# Patient Record
Sex: Female | Born: 1960 | Race: White | Hispanic: No | Smoking: Never smoker
Health system: Southern US, Community
[De-identification: ages and names within clinical notes are randomized; demographics above are authoritative.]

## PROBLEM LIST (undated history)

## (undated) DIAGNOSIS — C4490 Unspecified malignant neoplasm of skin, unspecified: Secondary | ICD-10-CM

## (undated) DIAGNOSIS — F32A Depression, unspecified: Secondary | ICD-10-CM

## (undated) DIAGNOSIS — M549 Dorsalgia, unspecified: Secondary | ICD-10-CM

## (undated) DIAGNOSIS — M810 Age-related osteoporosis without current pathological fracture: Secondary | ICD-10-CM

## (undated) DIAGNOSIS — K219 Gastro-esophageal reflux disease without esophagitis: Secondary | ICD-10-CM

## (undated) DIAGNOSIS — M1990 Unspecified osteoarthritis, unspecified site: Secondary | ICD-10-CM

## (undated) DIAGNOSIS — C801 Malignant (primary) neoplasm, unspecified: Secondary | ICD-10-CM

## (undated) DIAGNOSIS — D0510 Intraductal carcinoma in situ of unspecified breast: Secondary | ICD-10-CM

## (undated) DIAGNOSIS — K589 Irritable bowel syndrome without diarrhea: Secondary | ICD-10-CM

## (undated) DIAGNOSIS — B1920 Unspecified viral hepatitis C without hepatic coma: Secondary | ICD-10-CM

## (undated) DIAGNOSIS — F329 Major depressive disorder, single episode, unspecified: Secondary | ICD-10-CM

## (undated) DIAGNOSIS — Z9221 Personal history of antineoplastic chemotherapy: Secondary | ICD-10-CM

## (undated) DIAGNOSIS — F419 Anxiety disorder, unspecified: Secondary | ICD-10-CM

## (undated) DIAGNOSIS — B009 Herpesviral infection, unspecified: Secondary | ICD-10-CM

## (undated) DIAGNOSIS — C50919 Malignant neoplasm of unspecified site of unspecified female breast: Secondary | ICD-10-CM

## (undated) HISTORY — PX: TONSILLECTOMY: SUR1361

## (undated) HISTORY — DX: Malignant neoplasm of unspecified site of unspecified female breast: C50.919

## (undated) HISTORY — PX: FOOT SURGERY: SHX648

## (undated) HISTORY — PX: BREAST SURGERY: SHX581

## (undated) HISTORY — PX: OTHER SURGICAL HISTORY: SHX169

## (undated) HISTORY — PX: MASTECTOMY: SHX3

## (undated) HISTORY — DX: Irritable bowel syndrome, unspecified: K58.9

## (undated) HISTORY — PX: ABDOMINAL HYSTERECTOMY: SHX81

## (undated) HISTORY — PX: MASTECTOMY, MODIFIED RADICAL W/RECONSTRUCTION: SHX708

## (undated) HISTORY — DX: Herpesviral infection, unspecified: B00.9

## (undated) HISTORY — DX: Unspecified viral hepatitis C without hepatic coma: B19.20

---
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
## 1997-01-12 HISTORY — PX: TOTAL ABDOMINAL HYSTERECTOMY W/ BILATERAL SALPINGOOPHORECTOMY: SHX83

## 1997-09-03 ENCOUNTER — Other Ambulatory Visit: Admission: RE | Admit: 1997-09-03 | Discharge: 1997-09-03 | Payer: Self-pay | Admitting: Gynecology

## 1997-12-27 ENCOUNTER — Ambulatory Visit (HOSPITAL_BASED_OUTPATIENT_CLINIC_OR_DEPARTMENT_OTHER): Admission: RE | Admit: 1997-12-27 | Discharge: 1997-12-27 | Payer: Self-pay | Admitting: Orthopedic Surgery

## 1998-06-13 ENCOUNTER — Ambulatory Visit (HOSPITAL_BASED_OUTPATIENT_CLINIC_OR_DEPARTMENT_OTHER): Admission: RE | Admit: 1998-06-13 | Discharge: 1998-06-13 | Payer: Self-pay | Admitting: Orthopedic Surgery

## 1998-10-10 ENCOUNTER — Other Ambulatory Visit: Admission: RE | Admit: 1998-10-10 | Discharge: 1998-10-10 | Payer: Self-pay | Admitting: Gynecology

## 1998-10-13 HISTORY — PX: CHOLECYSTECTOMY: SHX55

## 1999-09-30 ENCOUNTER — Emergency Department (HOSPITAL_COMMUNITY): Admission: EM | Admit: 1999-09-30 | Discharge: 1999-09-30 | Payer: Self-pay | Admitting: Emergency Medicine

## 1999-10-23 ENCOUNTER — Other Ambulatory Visit: Admission: RE | Admit: 1999-10-23 | Discharge: 1999-10-23 | Payer: Self-pay | Admitting: Gynecology

## 1999-12-18 ENCOUNTER — Encounter: Admission: RE | Admit: 1999-12-18 | Discharge: 2000-03-17 | Payer: Self-pay | Admitting: Gynecology

## 2000-10-26 ENCOUNTER — Encounter: Admission: RE | Admit: 2000-10-26 | Discharge: 2001-01-24 | Payer: Self-pay | Admitting: Gynecology

## 2000-11-04 ENCOUNTER — Other Ambulatory Visit: Admission: RE | Admit: 2000-11-04 | Discharge: 2000-11-04 | Payer: Self-pay | Admitting: Gynecology

## 2001-04-19 ENCOUNTER — Ambulatory Visit (HOSPITAL_COMMUNITY): Admission: RE | Admit: 2001-04-19 | Discharge: 2001-04-19 | Payer: Self-pay | Admitting: Gastroenterology

## 2001-06-24 ENCOUNTER — Ambulatory Visit (HOSPITAL_BASED_OUTPATIENT_CLINIC_OR_DEPARTMENT_OTHER): Admission: RE | Admit: 2001-06-24 | Discharge: 2001-06-24 | Payer: Self-pay | Admitting: Orthopedic Surgery

## 2001-11-08 ENCOUNTER — Other Ambulatory Visit: Admission: RE | Admit: 2001-11-08 | Discharge: 2001-11-08 | Payer: Self-pay | Admitting: Gynecology

## 2002-06-06 ENCOUNTER — Ambulatory Visit (HOSPITAL_COMMUNITY): Admission: RE | Admit: 2002-06-06 | Discharge: 2002-06-06 | Payer: Self-pay | Admitting: Orthopedic Surgery

## 2002-06-06 ENCOUNTER — Encounter: Payer: Self-pay | Admitting: Orthopedic Surgery

## 2002-08-30 ENCOUNTER — Ambulatory Visit (HOSPITAL_BASED_OUTPATIENT_CLINIC_OR_DEPARTMENT_OTHER): Admission: RE | Admit: 2002-08-30 | Discharge: 2002-08-30 | Payer: Self-pay | Admitting: Orthopedic Surgery

## 2004-03-05 ENCOUNTER — Ambulatory Visit: Payer: Self-pay | Admitting: Hematology & Oncology

## 2004-11-04 ENCOUNTER — Ambulatory Visit: Payer: Self-pay | Admitting: Internal Medicine

## 2005-01-23 ENCOUNTER — Ambulatory Visit (HOSPITAL_BASED_OUTPATIENT_CLINIC_OR_DEPARTMENT_OTHER): Admission: RE | Admit: 2005-01-23 | Discharge: 2005-01-23 | Payer: Self-pay | Admitting: Orthopedic Surgery

## 2005-11-12 ENCOUNTER — Ambulatory Visit: Payer: Self-pay | Admitting: Internal Medicine

## 2006-08-19 ENCOUNTER — Ambulatory Visit: Payer: Self-pay | Admitting: Internal Medicine

## 2006-12-07 ENCOUNTER — Ambulatory Visit: Payer: Self-pay | Admitting: Internal Medicine

## 2007-02-21 ENCOUNTER — Ambulatory Visit: Payer: Self-pay | Admitting: Internal Medicine

## 2007-09-16 ENCOUNTER — Encounter (INDEPENDENT_AMBULATORY_CARE_PROVIDER_SITE_OTHER): Payer: Self-pay | Admitting: Interventional Radiology

## 2007-09-16 ENCOUNTER — Ambulatory Visit (HOSPITAL_COMMUNITY): Admission: RE | Admit: 2007-09-16 | Discharge: 2007-09-16 | Payer: Self-pay | Admitting: Gastroenterology

## 2007-11-06 ENCOUNTER — Ambulatory Visit: Payer: Self-pay | Admitting: Family Medicine

## 2008-01-25 ENCOUNTER — Ambulatory Visit: Payer: Self-pay | Admitting: Internal Medicine

## 2008-05-14 ENCOUNTER — Ambulatory Visit: Payer: Self-pay | Admitting: Internal Medicine

## 2008-08-29 ENCOUNTER — Emergency Department: Payer: Self-pay | Admitting: Emergency Medicine

## 2008-12-05 ENCOUNTER — Encounter: Admission: RE | Admit: 2008-12-05 | Discharge: 2008-12-05 | Payer: Self-pay | Admitting: Gastroenterology

## 2009-01-29 ENCOUNTER — Ambulatory Visit: Payer: Self-pay | Admitting: Internal Medicine

## 2010-01-15 ENCOUNTER — Encounter
Admission: RE | Admit: 2010-01-15 | Discharge: 2010-01-15 | Payer: Self-pay | Source: Home / Self Care | Attending: Gastroenterology | Admitting: Gastroenterology

## 2010-05-30 NOTE — Op Note (Signed)
Belinda Norman, Belinda Norman                           ACCOUNT NO.:  [id number]   MEDICAL RECORD NO.:  [id number]                   PATIENT TYPE:  AMB   LOCATION:  DSC                                  FACILITY:  [facility]   PHYSICIAN:  Harvie Junior, M.D.                DATE OF BIRTH:  06-22-1960   DATE OF PROCEDURE:  08/30/2002  DATE OF DISCHARGE:                                 OPERATIVE REPORT   PREOPERATIVE DIAGNOSIS:  1. Tarsal tunnel syndrome, left foot.  2. Plantar fasciitis with painful bone spur, left heel.   POSTOPERATIVE DIAGNOSIS:  1. Tarsal tunnel syndrome, left foot.  2. Plantar fasciitis with painful bone spur, left heel.   OPERATION PERFORMED:  1. Release of tibial nerve and release of tarsal tunnel syndrome.  2. Plantar fascia release.  3. Excision of plantar bone spur.   SURGEON:  Harvie Junior, M.D.   ASSISTANT:  Marshia Ly, P.A.   ANESTHESIA:  General.   INDICATIONS FOR PROCEDURE:  The patient is a 50 year old female with a long  history of having heel pain.  She ultimately had been injected in the area  of the tibial nerve with some improvement.  She had been injected in the  area of the plantar fascia with improvement.  She had continued pain, failed  a  year of conservative care for heel pain.  Because of continuing  complaints of pain, we ultimately felt that surgical intervention was  appropriate.  We discussed treatment options and ultimately felt that  release of the tibial nerve as well as plantar fascia release would be  appropriate and that excision of the bone spur would be important.  She was  brought to the operating room for this procedure.   DESCRIPTION OF PROCEDURE:  The patient was brought to the operating room and  after adequate anesthesia was obtained with general anesthetic, the patient  was supine on the table.  The left leg was prepped and draped in the usual  sterile fashion.  Following Esmarch exsanguination of the extremity a  blood  pressure tourniquet was inflated to .  Following this, a linear  incision was made on the medial aspect of  the heel.  Subcutaneous tissue  dissected down to the level of the tarsal tunnel.  The tibial nerve was  identified and followed down to its branches of the medial and lateral  plantar nerves and this was followed down to the calcaneal branch.  These  dived into the abductor hallucis muscle and the fascia of the abductor  hallucis was divided posteriorly, what essentially would be laterally and  medially.  This freed up the nerve onto the plantar aspect of the heel.  At  this point attention was turned towards the nerve which was then freed up  proximally and all bands across the nerve were released.  At this point  there was significant engorgement  of vessels in this area as well as one  interesting branch which came posteriorly and felt that this probably was as  much a compressing agent as anything.  At this point attention was turned to  the plantar aspect.  The incision was extended distally.  The abductor  hallucis was retracted proximally and the plantar fascia was identified and  then divided across the bottom of the foot.  Care was taken to identify the  proximal and distal portions of this.  Once this has been done, the flexor  hallucis brevis muscle was identified and tracked proximally to its origin  on the heel.  The flexor hallucis was then taken down with care to protect  the superior lying nerve and vessel and then the heel spur was resected with  an osteotome and debrided with a rongeur.  At this point fluoroscopy was  used to ensure adequate localization of the heel spur.  The wound was  copiously irrigated and suctioned dry and the skin was closed with  interrupted suture.  A sterile compressive dressing was applied and the  patient was placed into plaster.  She was then transferred to the recovery  room where she was noted to be in satisfactory  condition.  The estimated  blood loss for this procedure was none.                                                Harvie Junior, M.D.    Ranae Plumber  D:  08/30/2002  T:  08/30/2002  Job:  [id number]

## 2010-05-30 NOTE — Op Note (Signed)
NAME:  Belinda Norman, Belinda Norman               ACCOUNT NO.:  [id number]   MEDICAL RECORD NO.:  [id number]          PATIENT TYPE:  AMB   LOCATION:  DSC                          FACILITY:  [facility]   PHYSICIAN:  Harvie Junior, M.D.   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   POSTOPERATIVE DIAGNOSIS:  1.  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  2.  Tight lateral retinaculum.  3.  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   PROCEDURE:  1.  Partial posterior horn medial meniscectomy with debridement of the      medial femoral condyle.  2.  Chondroplasty of the patellofemoral joint.  3.  Lateral retinacular release.   SURGEON:  Harvie Junior, M.D.   ASSISTANT:  Marshia Ly, P.A.-C.   ANESTHESIA:  General.   BRIEF HISTORY:  50 year old female with a long history of having had  previous knee arthroscopy.  She has had continued and persistent anterior  knee pain status post her last arthroscopy increasing over the past year.  Since that period of time, we have treated her with steroid injection, anti-  inflammatory medication, activity modification, and quad strengthening.  She  has failed all this and because of continued complaints of pain, she was  ultimately taken to the operating room for operative knee arthroscopy.   DESCRIPTION OF PROCEDURE:  The patient was taken to the operating room and  after adequate anesthesia was obtained with general anesthesia, the patient  was placed on the operating table, the right leg was prepped and draped in  the usual sterile fashion.  Following this, routine arthroscopic examination  of the knee revealed there was an obvious posterior horn medial meniscal  tear.  This was debrided with the straight biting forceps back to a  smooth  and stable rim.  Attention was turned to the medial femoral condyle which  had some grade 2 chondromalacia which was debrided to smooth and stable  situation.  Attention was turned to the lateral side which was pristine, ACL  normal.  The patellofemoral joint was evaluated and had a little bit of  lateral chondromalacia and a little bit of tightness on the lateral side and  a little bit of lateral patellar tracking.  At this point, the area of  lateral release was re-released.  Certainly, the area behind the patella  became much less tight, and the wound was copiously irrigated and  suctioned dry.  The arthroscopic portals were closed with a bandage.  A  sterile compressive dressing was applied.  The patient was taken to the  recovery room and was noted to be in satisfactory condition.  Estimated  blood loss was none.      Harvie Junior, M.D.  Electronically Signed     JLG/MEDQ  D:  01/23/2005  T:  01/23/2005  Job:  [id number]

## 2010-10-15 LAB — CBC
HCT: 38.1
Hemoglobin: 13.2
Hemoglobin: 16.2 — ABNORMAL HIGH
MCHC: 35.2
MCV: 95.6
RDW: 12.4
WBC: 5.1

## 2010-10-15 LAB — APTT: aPTT: 32

## 2011-09-15 DIAGNOSIS — M533 Sacrococcygeal disorders, not elsewhere classified: Secondary | ICD-10-CM | POA: Insufficient documentation

## 2011-11-11 ENCOUNTER — Telehealth: Payer: Self-pay | Admitting: Internal Medicine

## 2011-11-11 NOTE — Telephone Encounter (Signed)
Pt is wanting to follow you over here to [place] from [place]. 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 15th. She was wondering if she could come in to have this done. Or if she could come in to have the labs completed for the form and come back later for the CPE. She does apologize for such short notice. Best number  Work :[phone] [phone]

## 2011-11-11 NOTE — Telephone Encounter (Signed)
See if she can come on 11/18/11 at 12:00.  Work in for this.  Thanks.

## 2011-11-12 NOTE — Telephone Encounter (Signed)
Pt aware of appointment 

## 2011-11-18 ENCOUNTER — Ambulatory Visit (INDEPENDENT_AMBULATORY_CARE_PROVIDER_SITE_OTHER): Payer: BC Managed Care – PPO | Admitting: Internal Medicine

## 2011-11-18 ENCOUNTER — Encounter: Payer: Self-pay | Admitting: Internal Medicine

## 2011-11-18 VITALS — BP 110/77 | HR 67 | Temp 97.8°F | Resp 18 | Ht 66.0 in | Wt 150.0 lb

## 2011-11-18 DIAGNOSIS — R5383 Other fatigue: Secondary | ICD-10-CM

## 2011-11-18 DIAGNOSIS — F329 Major depressive disorder, single episode, unspecified: Secondary | ICD-10-CM

## 2011-11-18 DIAGNOSIS — Z139 Encounter for screening, unspecified: Secondary | ICD-10-CM

## 2011-11-18 DIAGNOSIS — K219 Gastro-esophageal reflux disease without esophagitis: Secondary | ICD-10-CM

## 2011-11-18 DIAGNOSIS — B1920 Unspecified viral hepatitis C without hepatic coma: Secondary | ICD-10-CM

## 2011-11-18 LAB — CBC WITH DIFFERENTIAL/PLATELET
Basophils Relative: 0.5 % (ref 0.0–3.0)
Eosinophils Absolute: 0.1 10*3/uL (ref 0.0–0.7)
HCT: 44.6 % (ref 36.0–46.0)
Hemoglobin: 15.2 g/dL — ABNORMAL HIGH (ref 12.0–15.0)
Lymphocytes Relative: 27.6 % (ref 12.0–46.0)
Lymphs Abs: 1.2 10*3/uL (ref 0.7–4.0)
MCHC: 34.1 g/dL (ref 30.0–36.0)
MCV: 96.6 fl (ref 78.0–100.0)
Monocytes Absolute: 0.3 10*3/uL (ref 0.1–1.0)
Neutro Abs: 2.7 10*3/uL (ref 1.4–7.7)
RBC: 4.62 Mil/uL (ref 3.87–5.11)
RDW: 12.4 % (ref 11.5–14.6)

## 2011-11-18 LAB — LIPID PANEL
LDL Cholesterol: 72 mg/dL (ref 0–99)
Total CHOL/HDL Ratio: 3
Triglycerides: 88 mg/dL (ref 0.0–149.0)

## 2011-11-18 MED ORDER — PANTOPRAZOLE SODIUM 40 MG PO TBEC: 40.0000 mg | DELAYED_RELEASE_TABLET | Freq: Every day | ORAL | Status: DC

## 2011-11-18 NOTE — Patient Instructions (Addendum)
It was nice seeing you today.  I am sorry about your father.  We will notify you of your lab results - once they are available.  I am going to start you on Protonix (take one 30 minutes before breakfast).  Keep your follow up appt with Dr Kinnie Scales.

## 2011-11-20 ENCOUNTER — Telehealth: Payer: Self-pay | Admitting: Internal Medicine

## 2011-11-20 DIAGNOSIS — D696 Thrombocytopenia, unspecified: Secondary | ICD-10-CM

## 2011-11-20 NOTE — Telephone Encounter (Signed)
Notify pt cholesterol looks good.  Sugar is wnl.  Hemoglobin is ok but platelet count is decreased.  Will need to follow to confirm stable/improved.  i want her to return for a cbc in 10 days.  Please schedule a time for pt to come back in.

## 2011-11-20 NOTE — Telephone Encounter (Signed)
Scheduled patient for Labs on 11/14 at 9:00am. Did you want to see her for a patient visit.

## 2011-11-20 NOTE — Telephone Encounter (Signed)
Left message for patient to return call.

## 2011-11-20 NOTE — Telephone Encounter (Signed)
No just labs

## 2011-11-24 ENCOUNTER — Telehealth: Payer: Self-pay | Admitting: Internal Medicine

## 2011-11-24 NOTE — Telephone Encounter (Signed)
Pt is coming in Thursday for blood work. Pt was seen by Dr. Kinnie Scales and would like you to call her back. Please call pt at work number after 10 am.

## 2011-11-24 NOTE — Telephone Encounter (Signed)
Called patient to let her know.Patient said that she signed a paper while was at Dr. Jennye Boroughs to have tham send you a copy a labs.

## 2011-11-24 NOTE — Telephone Encounter (Signed)
She does not need to come back here for follow up platelet count if she just had rechecked, but have her call Dr Rossie Muskrat office and have them send me a copy of her labs to compare.

## 2011-11-24 NOTE — Telephone Encounter (Signed)
Please call and find out what she needs.   Thanks.

## 2011-11-24 NOTE — Telephone Encounter (Signed)
Called patient at the work. Patient stated that she went to see Dr. Kinnie Scales and her platelet count is low. They are sending her to [facility] for further testing. She wanted to know if she still needed to come by for labs on Thursday since she has to go to [facility]?

## 2011-11-26 ENCOUNTER — Other Ambulatory Visit: Payer: BC Managed Care – PPO

## 2011-12-05 ENCOUNTER — Encounter: Payer: Self-pay | Admitting: Internal Medicine

## 2011-12-05 DIAGNOSIS — F329 Major depressive disorder, single episode, unspecified: Secondary | ICD-10-CM | POA: Insufficient documentation

## 2011-12-05 DIAGNOSIS — K219 Gastro-esophageal reflux disease without esophagitis: Secondary | ICD-10-CM | POA: Insufficient documentation

## 2011-12-05 NOTE — Progress Notes (Signed)
  Subjective:    Patient ID: Belinda Norman, female    DOB: 1960/12/18, 51 y.o.   MRN: [id number]  HPI 51 year old female with past history of hepatitis C, IBS and depression/anxiety who comes in today as a work in to have a work form completed.  She has been under a lot of stress lately.  Her father died in 2022/07/22.  She broke up with her long term boyfriend.  They are back together now.  Feels she is doing better.  Handling stress relatively well.  We did discuss my desire for her to see a counselor/psychiatrist.  No suicidal ideations.  She feels she is doing better.  She has been having trouble with increased acid reflux.  Has worsened over the last two months.  Has been taking aloe vera.  Due to follow up with Dr Kinnie Scales next week.  Has labs and an ultrasound.  No abdominal pain and no significant bowel change.    Past Medical History  Diagnosis Date  . Hepatitis C   . HSV (herpes simplex virus) infection   . IBS (irritable bowel syndrome)     Review of Systems Patient denies any headache, lightheadedness or dizziness.  No sinus or allergy symptoms. No chest pain, tightness or palpitations.  No increased shortness of breath, cough or congestion.  No nausea or vomiting.  Does report the increased acid reflux as outlined.  No abdominal pain or cramping.  No significant  bowel change, such as diarrhea, constipation, BRBPR or melana.  No urine change.        Objective:   Physical Exam Filed Vitals:   11/18/11 1209  BP: 110/77  Pulse: 67  Temp: 97.8 F (36.6 C)  Resp: 28   51 year old female in no acute distress.   HEENT:  Nares - clear.  OP- without lesions or erythema.  NECK:  Supple, nontender.  No audible bruit.   HEART:  Appears to be regular. LUNGS:  Without crackles or wheezing audible.  Respirations even and unlabored.   RADIAL PULSE:  Equal bilaterally.  ABDOMEN:  Soft, nontender.  No audible abdominal bruit.   EXTREMITIES:  No increased edema to be present.                    Assessment & Plan:  FORM COMPLETION.  Will obtain the recommended labs and then complete form.    HEALTH MAINTENANCE.  Obtain outside records for review.  Keep her on track with her physicals and mammograms.

## 2011-12-05 NOTE — Assessment & Plan Note (Signed)
Had labs and an ultrasound ordered by Dr Kinnie Scales.  Due to see him next week.

## 2011-12-05 NOTE — Assessment & Plan Note (Signed)
Worsening acid reflux.  On no prescription medication.  Start Protonix 40mg  q day.  Follow.  She will discuss more with Dr Kinnie Scales with question of need for EGD.

## 2011-12-05 NOTE — Assessment & Plan Note (Signed)
Has a history of depression/anxiety.  On Citalopram.  Increased stress recently with her father's death and her relationship with her boyfriend.  Doing better now and feels better.  Discussed counseling and referral to psychiatry.  Will notify me when agreeable.

## 2012-01-18 ENCOUNTER — Other Ambulatory Visit (HOSPITAL_COMMUNITY): Payer: Self-pay | Admitting: Gastroenterology

## 2012-01-18 DIAGNOSIS — B182 Chronic viral hepatitis C: Secondary | ICD-10-CM

## 2012-01-19 ENCOUNTER — Other Ambulatory Visit: Payer: Self-pay | Admitting: Radiology

## 2012-01-19 ENCOUNTER — Encounter (HOSPITAL_COMMUNITY): Payer: Self-pay

## 2012-01-21 ENCOUNTER — Ambulatory Visit (HOSPITAL_COMMUNITY)
Admission: RE | Admit: 2012-01-21 | Discharge: 2012-01-21 | Disposition: A | Discharge status: Home / Self Care | Payer: BC Managed Care – PPO | Source: Ambulatory Visit | Attending: Gastroenterology | Admitting: Gastroenterology

## 2012-01-21 ENCOUNTER — Encounter (HOSPITAL_COMMUNITY): Payer: Self-pay

## 2012-01-21 VITALS — BP 110/61 | HR 64 | Temp 97.5°F | Resp 12 | Ht 66.0 in | Wt 155.0 lb

## 2012-01-21 DIAGNOSIS — B182 Chronic viral hepatitis C: Secondary | ICD-10-CM | POA: Insufficient documentation

## 2012-01-21 LAB — CBC
MCH: 32.7 pg (ref 26.0–34.0)
Platelets: 80 10*3/uL — ABNORMAL LOW (ref 150–400)
RBC: 4.46 MIL/uL (ref 3.87–5.11)

## 2012-01-21 LAB — APTT: aPTT: 33 seconds (ref 24–37)

## 2012-01-21 LAB — PROTIME-INR
INR: 1.13 (ref 0.00–1.49)
Prothrombin Time: 14.3 seconds (ref 11.6–15.2)

## 2012-01-21 MED ORDER — HYDROMORPHONE HCL PF 1 MG/ML IJ SOLN
2.0000 mg | INTRAMUSCULAR | Status: AC
  Administered 2012-01-21: 2 mg via INTRAVENOUS

## 2012-01-21 MED ORDER — MIDAZOLAM HCL 2 MG/2ML IJ SOLN
INTRAMUSCULAR | Status: AC | PRN
  Administered 2012-01-21 (×2): 1 mg via INTRAVENOUS

## 2012-01-21 MED ORDER — FENTANYL CITRATE 0.05 MG/ML IJ SOLN
INTRAMUSCULAR | Status: AC | PRN
  Administered 2012-01-21 (×2): 50 ug via INTRAVENOUS

## 2012-01-21 MED ORDER — SODIUM CHLORIDE 0.9 % IV SOLN
Freq: Once | INTRAVENOUS | Status: AC
  Administered 2012-01-21: 09:00:00 via INTRAVENOUS

## 2012-01-21 MED ORDER — HYDROMORPHONE HCL PF 2 MG/ML IJ SOLN
2.0000 mg | INTRAMUSCULAR | Status: DC
  Filled 2012-01-21: qty 1

## 2012-01-21 MED ORDER — MIDAZOLAM HCL 2 MG/2ML IJ SOLN
INTRAMUSCULAR | Status: AC
  Filled 2012-01-21: qty 4

## 2012-01-21 MED ORDER — FENTANYL CITRATE 0.05 MG/ML IJ SOLN
INTRAMUSCULAR | Status: AC
  Filled 2012-01-21: qty 4

## 2012-01-21 MED ORDER — HYDROMORPHONE HCL PF 2 MG/ML IJ SOLN
2.0000 mg | INTRAMUSCULAR | Status: DC
  Filled 2012-01-21: qty 1
  Filled 2012-01-21: qty 2
  Filled 2012-01-21: qty 1

## 2012-01-21 NOTE — H&P (Signed)
Belinda Norman is an 52 y.o. female.   Chief Complaint: Hep C x 20 yrs Evaluation for new treatment Scheduled for liver core biopsy HPI: Hep C; IBS  Past Medical History  Diagnosis Date  . Hepatitis C   . HSV (herpes simplex virus) infection   . IBS (irritable bowel syndrome)     Past Surgical History  Procedure Date  . Cholecystectomy 10/1998  . Total abdominal hysterectomy w/ bilateral salpingoophorectomy 1999  . Cesarean section   . Tonsillectomy   . Arthroscopic knee surgery     x2  . Knee surgery with nerve repair   . Foot surgery     Family History  Problem Relation Age of Onset  . Hypertension Father   . Liver cancer Mother     bile duct cancer  . Alcohol abuse Mother   . Diabetes Brother   . Breast cancer Paternal Aunt   . Throat cancer Paternal Grandmother   . Prostate cancer Paternal Grandfather   . Colon cancer Neg Hx    Social History:  reports that she has never smoked. She has never used smokeless tobacco. She reports that she drinks alcohol. She reports that she does not use illicit drugs.  Allergies:  Allergies  Allergen Reactions  . Codeine Nausea Only     (Not in a hospital admission)  No results found for this or any previous visit (from the past 48 hour(s)). No results found.  Review of Systems  Constitutional: Negative for fever and weight loss.  Respiratory: Negative for shortness of breath.   Cardiovascular: Negative for chest pain.  Gastrointestinal: Negative for nausea and vomiting.  Neurological: Negative for headaches.    Blood pressure 117/64, pulse 58, temperature 98.2 F (36.8 C), temperature source Oral, resp. rate 20, height 5\' 6"  (1.676 m), weight 155 lb (70.308 kg), SpO2 100.00%. Physical Exam  Constitutional: She appears well-developed and well-nourished.  Cardiovascular: Normal rate and regular rhythm.   No murmur heard. Respiratory: Effort normal and breath sounds normal. She has no wheezes.  GI: Soft. Bowel sounds  are normal. There is no tenderness.  Musculoskeletal: Normal range of motion.  Neurological: She is alert.  Skin: Skin is warm.  Psychiatric: She has a normal mood and affect. Her behavior is normal. Judgment and thought content normal.     Assessment/Plan Hep C x 20 yrs Possible new tx Scheduled for liver core biopsy Pt aware of procedure benefits and risks and agreeable to proceed Consent signed and in chart  Lerone Onder A 01/21/2012, 9:23 AM

## 2012-01-21 NOTE — ED Notes (Signed)
Requested SS-C bed 

## 2012-01-21 NOTE — ED Notes (Signed)
Patient states that she is unable to take hydrocodone for pain due to nausea.  Patient request a different pain med. Caryn Bee, PA notified.

## 2012-01-21 NOTE — Procedures (Signed)
US liver core biopsy 18g x3 No complication No blood loss. See complete dictation in [facility].

## 2012-01-21 NOTE — ED Notes (Signed)
Caryn Bee, PA returned page.  He request that when patient needs pain medication to page him for the orders.

## 2012-02-16 ENCOUNTER — Other Ambulatory Visit: Payer: Self-pay | Admitting: *Deleted

## 2012-02-16 MED ORDER — PANTOPRAZOLE SODIUM 40 MG PO TBEC: 40.0000 mg | DELAYED_RELEASE_TABLET | Freq: Every day | ORAL | Status: DC

## 2012-02-27 ENCOUNTER — Other Ambulatory Visit: Payer: Self-pay

## 2012-04-07 ENCOUNTER — Telehealth: Payer: Self-pay | Admitting: Internal Medicine

## 2012-04-07 NOTE — Telephone Encounter (Signed)
citalopram (CELEXA) 40 MG tablet ° °

## 2012-04-08 ENCOUNTER — Telehealth: Payer: Self-pay | Admitting: Internal Medicine

## 2012-04-08 MED ORDER — CITALOPRAM HYDROBROMIDE 40 MG PO TABS: 40.0000 mg | ORAL_TABLET | Freq: Every day | ORAL | Status: DC

## 2012-04-08 NOTE — Telephone Encounter (Signed)
Already filled

## 2012-04-08 NOTE — Telephone Encounter (Signed)
Refilled citalopram #90 with one refill.   

## 2012-06-14 ENCOUNTER — Ambulatory Visit (INDEPENDENT_AMBULATORY_CARE_PROVIDER_SITE_OTHER): Payer: BC Managed Care – PPO | Admitting: Internal Medicine

## 2012-06-14 ENCOUNTER — Encounter: Payer: Self-pay | Admitting: Internal Medicine

## 2012-06-14 VITALS — BP 110/70 | HR 57 | Temp 98.1°F | Ht 67.5 in | Wt 156.2 lb

## 2012-06-14 DIAGNOSIS — B182 Chronic viral hepatitis C: Secondary | ICD-10-CM

## 2012-06-14 DIAGNOSIS — F329 Major depressive disorder, single episode, unspecified: Secondary | ICD-10-CM

## 2012-06-14 DIAGNOSIS — Z13220 Encounter for screening for lipoid disorders: Secondary | ICD-10-CM

## 2012-06-14 DIAGNOSIS — Z1239 Encounter for other screening for malignant neoplasm of breast: Secondary | ICD-10-CM

## 2012-06-14 DIAGNOSIS — Z131 Encounter for screening for diabetes mellitus: Secondary | ICD-10-CM

## 2012-06-14 DIAGNOSIS — K219 Gastro-esophageal reflux disease without esophagitis: Secondary | ICD-10-CM

## 2012-06-14 DIAGNOSIS — D696 Thrombocytopenia, unspecified: Secondary | ICD-10-CM

## 2012-06-14 LAB — CBC WITH DIFFERENTIAL/PLATELET
Basophils Relative: 0.6 % (ref 0.0–3.0)
Eosinophils Absolute: 0.1 10*3/uL (ref 0.0–0.7)
MCHC: 34.6 g/dL (ref 30.0–36.0)
MCV: 96.2 fl (ref 78.0–100.0)
Monocytes Absolute: 0.3 10*3/uL (ref 0.1–1.0)
Neutrophils Relative %: 60.6 % (ref 43.0–77.0)
Platelets: 114 10*3/uL — ABNORMAL LOW (ref 150.0–400.0)
RBC: 4.47 Mil/uL (ref 3.87–5.11)

## 2012-06-14 LAB — BASIC METABOLIC PANEL WITH GFR
BUN: 17 mg/dL (ref 6–23)
CO2: 28 meq/L (ref 19–32)
Calcium: 9.8 mg/dL (ref 8.4–10.5)
Chloride: 106 meq/L (ref 96–112)
Creatinine, Ser: 0.7 mg/dL (ref 0.4–1.2)
GFR: 88.02 mL/min
Glucose, Bld: 95 mg/dL (ref 70–99)
Potassium: 4.7 meq/L (ref 3.5–5.1)
Sodium: 140 meq/L (ref 135–145)

## 2012-06-14 LAB — LIPID PANEL
Cholesterol: 185 mg/dL (ref 0–200)
HDL: 57.2 mg/dL (ref >39.00)
Triglycerides: 78 mg/dL (ref 0.0–149.0)
VLDL: 15.6 mg/dL (ref 0.0–40.0)

## 2012-06-14 NOTE — Progress Notes (Signed)
  Subjective:    Patient ID: Belinda Norman, female    DOB: 12-13-1960, 52 y.o.   MRN: [id number]  HPI 52 year old female with past history of hepatitis C, IBS and depression/anxiety who comes in today to follow up on these issues as well as for her complete physical exam.  Handling stress relatively well.  Feels she is doing better.   She has just completed treatment for Hepatitis C.  States her viral load was undetectable.  She is in a study now.  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  Planning to see Dr Corky Sing at [facility].  Also has f/u with Dr Troy Sine 07/01/12.  States overall she feels she is doing better.  No nausea or vomiting.  No bowel change.  Breathing stable.      Past Medical History  Diagnosis Date  . Hepatitis C   . HSV (herpes simplex virus) infection   . IBS (irritable bowel syndrome)     Current Outpatient Prescriptions on File Prior to Visit  Medication Sig Dispense Refill  . acyclovir (ZOVIRAX) 400 MG tablet Take 400 mg by mouth as needed. For outbreak      . citalopram (CELEXA) 40 MG tablet Take 1 tablet (40 mg total) by mouth daily.  90 tablet  1  . ibuprofen (ADVIL,MOTRIN) 800 MG tablet Take 800 mg by mouth every 8 (eight) hours as needed. pain      . pantoprazole (PROTONIX) 40 MG tablet Take 1 tablet (40 mg total) by mouth daily.  30 tablet  3   No current facility-administered medications on file prior to visit.    Review of Systems Patient denies any headache, lightheadedness or dizziness.  No sinus or allergy symptoms. No chest pain, tightness or palpitations.  No increased shortness of breath, cough or congestion.  No nausea or vomiting.   No abdominal pain or cramping.  No significant  bowel change, such as diarrhea, constipation, BRBPR or melana.  No urine change.  On protonix.  Handling stress relatively well.  Doing better.       Objective:   Physical Exam  Filed Vitals:   06/14/12 0810  BP: 110/70  Pulse: 57  Temp: 98.1 F (36.7 C)   Pulse recheck  33  52 year old female in no acute distress.   HEENT:  Nares- clear.  Oropharynx - without lesions. NECK:  Supple.  Nontender.  No audible bruit.  HEART:  Appears to be regular. LUNGS:  No crackles or wheezing audible.  Respirations even and unlabored.  RADIAL PULSE:  Equal bilaterally.    BREASTS:  No nipple discharge or nipple retraction present.  Could not appreciate any distinct nodules or axillary adenopathy.  ABDOMEN:  Soft, nontender.  Bowel sounds present and normal.  No audible abdominal bruit.  GU:  Normal external genitalia.  Vaginal vault without lesions.  S/P hysterectomy.  Could not appreciate any adnexal masses or tenderness.   RECTAL:  Heme negative.   EXTREMITIES:  No increased edema present.  DP pulses palpable and equal bilaterally.              Assessment & Plan:  FORM COMPLETION.  Will obtain the recommended labs and then complete form.    HEALTH MAINTENANCE. Physical today.  Obtain mammogram results.  Had performed by Dr Terrace Arabia.

## 2012-06-15 ENCOUNTER — Encounter: Payer: Self-pay | Admitting: Internal Medicine

## 2012-06-20 ENCOUNTER — Telehealth: Payer: Self-pay

## 2012-06-20 ENCOUNTER — Encounter: Payer: Self-pay | Admitting: Internal Medicine

## 2012-06-20 NOTE — Assessment & Plan Note (Signed)
Has a history of depression/anxiety.  On Citalopram.  Have discussed counseling and referral to psychiatry.  Will notify me when agreeable.  She feels she is dong better.  Follow.

## 2012-06-20 NOTE — Assessment & Plan Note (Signed)
On protonix.  Symptoms controlled.  Follow.  Continues to follow up with Dr Troy Sine.

## 2012-06-20 NOTE — Assessment & Plan Note (Signed)
S/P treatment.  Following with Dr Troy Sine.  Planning to see Dr Corky Sing.  States last viral load undetectable.  Has f/u with Dr Troy Sine 07/01/12.

## 2012-06-20 NOTE — Telephone Encounter (Signed)
My Chart Message: I reviewed your recent lab results. Your cholesterol looks good. You sugar is normal. They did run the complete blood count (from a previous order) and your platelet count is stable (actually improved - 114).   Patient notified of her results ^^^^^^^^^^^^^^^

## 2012-07-29 ENCOUNTER — Encounter: Payer: Self-pay | Admitting: Internal Medicine

## 2012-08-01 ENCOUNTER — Other Ambulatory Visit: Payer: Self-pay | Admitting: *Deleted

## 2012-08-01 MED ORDER — PANTOPRAZOLE SODIUM 40 MG PO TBEC: 40.0000 mg | DELAYED_RELEASE_TABLET | Freq: Every day | ORAL | Status: DC

## 2012-08-23 ENCOUNTER — Encounter: Payer: Self-pay | Admitting: Internal Medicine

## 2012-08-29 ENCOUNTER — Ambulatory Visit: Payer: Self-pay | Admitting: Internal Medicine

## 2012-10-27 ENCOUNTER — Other Ambulatory Visit: Payer: Self-pay | Admitting: Podiatry

## 2012-10-27 MED ORDER — MEPERIDINE HCL 50 MG PO TABS: ORAL_TABLET | ORAL | Status: DC

## 2012-10-27 MED ORDER — CEPHALEXIN 500 MG PO CAPS: 500.0000 mg | ORAL_CAPSULE | Freq: Three times a day (TID) | ORAL | Status: DC

## 2012-10-27 MED ORDER — PROMETHAZINE HCL 25 MG PO TABS: 25.0000 mg | ORAL_TABLET | Freq: Three times a day (TID) | ORAL | Status: DC | PRN

## 2012-10-28 ENCOUNTER — Encounter: Payer: Self-pay | Admitting: Podiatry

## 2012-10-28 DIAGNOSIS — G5760 Lesion of plantar nerve, unspecified lower limb: Secondary | ICD-10-CM

## 2012-11-02 ENCOUNTER — Ambulatory Visit (INDEPENDENT_AMBULATORY_CARE_PROVIDER_SITE_OTHER): Payer: BC Managed Care – PPO | Admitting: Podiatry

## 2012-11-02 ENCOUNTER — Encounter: Payer: Self-pay | Admitting: Podiatry

## 2012-11-02 VITALS — BP 112/47 | HR 65 | Temp 98.7°F | Resp 16 | Wt 149.0 lb

## 2012-11-02 DIAGNOSIS — G5760 Lesion of plantar nerve, unspecified lower limb: Secondary | ICD-10-CM

## 2012-11-02 DIAGNOSIS — G5782 Other specified mononeuropathies of left lower limb: Secondary | ICD-10-CM

## 2012-11-02 NOTE — Progress Notes (Signed)
Belinda Norman presents today for her first postop visit she is status post digital neuroplasty third interdigital space left foot. She denies fever chills nausea vomiting muscle aches or pains. She does state that the bandage seem to be too tight so she took it off the other day.  Objective: Vital signs are stable she is alert and oriented x3 left foot demonstrates considerable ecchymosis. The Xeroform gauze was still intact to the third interdigital space left. Do not demonstrate any erythema edema saline is drainage or odor.  Assessment: Well-healing surgical foot left.  Plan: Redressed the foot today with a dry sterile compressive dressing followup with her one week for suture removal.

## 2012-11-10 ENCOUNTER — Encounter: Payer: Self-pay | Admitting: Podiatry

## 2012-11-10 ENCOUNTER — Ambulatory Visit (INDEPENDENT_AMBULATORY_CARE_PROVIDER_SITE_OTHER): Payer: BC Managed Care – PPO | Admitting: Podiatry

## 2012-11-10 VITALS — BP 113/67 | HR 60 | Temp 99.0°F | Resp 16 | Ht 66.0 in | Wt 148.0 lb

## 2012-11-10 DIAGNOSIS — G5760 Lesion of plantar nerve, unspecified lower limb: Secondary | ICD-10-CM

## 2012-11-10 DIAGNOSIS — G5782 Other specified mononeuropathies of left lower limb: Secondary | ICD-10-CM

## 2012-11-10 DIAGNOSIS — Z9889 Other specified postprocedural states: Secondary | ICD-10-CM

## 2012-11-10 NOTE — Progress Notes (Signed)
Rudene presents today for suture removal status post digital neuroplasty third interdigital space of the left foot. Still states is quite tender.  Objective: Vital signs are stable she is alert and oriented x3. Pulses remain palpable left lower extremity. Still considerable ecchymosis to the foot. Sutures are intact margins are well coapted.  Assessment: Well-healing surgical foot left.  Plan: Suture removal today, j back into her regular shoe at all possible. Followup with me in 2 weeks.

## 2012-11-16 ENCOUNTER — Telehealth: Payer: Self-pay | Admitting: Internal Medicine

## 2012-11-16 NOTE — Telephone Encounter (Signed)
Pt is needing refill on Erstract???. Pt says Dr. Fredna Dow she is fine to take medication and would like it to be sent into [facility] in [place].

## 2012-11-16 NOTE — Telephone Encounter (Signed)
Has she been on this?  Not on her medication list.  Who has been prescribing?  Which doctor said "it would be ok to take"?

## 2012-11-17 ENCOUNTER — Ambulatory Visit: Payer: BC Managed Care – PPO

## 2012-11-17 ENCOUNTER — Other Ambulatory Visit: Payer: Self-pay

## 2012-11-17 NOTE — Telephone Encounter (Signed)
LMTCB

## 2012-11-17 NOTE — Telephone Encounter (Signed)
Will need to clarify what specific medicine she is taking.  (I assume it is some form of estrogen).  Since she is doing her physicals here now, I am ok to refill (if is estrogen).  (let me know if it is something else).  Only refill x 2.  She needs to keep her appt with me in 12/14.  Also, she needs her mammogram for 2014 if has not had.  Will need to get this to stay on medication.

## 2012-11-17 NOTE — Telephone Encounter (Signed)
Pt states that she has been on this for about 2 years now & it was originally filled by her OBGYN Dr. Stark Jock [facility] in [place]. Her gastrologist said it was okay to take. Please call patient work number instead of cell while she is at work due to bad reception [phone].

## 2012-11-18 ENCOUNTER — Other Ambulatory Visit: Payer: Self-pay | Admitting: *Deleted

## 2012-11-18 MED ORDER — ESTRADIOL 0.1 MG/GM VA CREA: 1.0000 | TOPICAL_CREAM | Freq: Every day | VAGINAL | Status: DC

## 2012-11-18 NOTE — Telephone Encounter (Signed)
Pt states that the medication she is needing is Estrace vaginal cream. I have sent it in for her & reminded her of her appt here on 12/14/12 @ 8:15am. Pt also states that she has had her mammogram this year. I have requested a copy from [facility].

## 2012-11-19 ENCOUNTER — Other Ambulatory Visit: Payer: Self-pay | Admitting: Internal Medicine

## 2012-11-19 NOTE — Telephone Encounter (Signed)
Noted.  Hold until receive.   

## 2012-11-21 ENCOUNTER — Encounter: Payer: Self-pay | Admitting: Internal Medicine

## 2012-11-21 NOTE — Telephone Encounter (Signed)
Mammogram received & in your folder (updated chart also)

## 2012-11-21 NOTE — Telephone Encounter (Signed)
noted 

## 2012-11-22 ENCOUNTER — Ambulatory Visit: Payer: BC Managed Care – PPO

## 2012-11-30 ENCOUNTER — Ambulatory Visit: Payer: Self-pay | Admitting: Physician Assistant

## 2012-12-01 ENCOUNTER — Encounter: Payer: BC Managed Care – PPO | Admitting: Podiatry

## 2012-12-13 ENCOUNTER — Encounter: Payer: Self-pay | Admitting: *Deleted

## 2012-12-14 ENCOUNTER — Ambulatory Visit (INDEPENDENT_AMBULATORY_CARE_PROVIDER_SITE_OTHER): Payer: BC Managed Care – PPO | Admitting: Internal Medicine

## 2012-12-14 ENCOUNTER — Encounter: Payer: Self-pay | Admitting: Internal Medicine

## 2012-12-14 ENCOUNTER — Encounter: Payer: Self-pay | Admitting: Podiatry

## 2012-12-14 ENCOUNTER — Ambulatory Visit (INDEPENDENT_AMBULATORY_CARE_PROVIDER_SITE_OTHER): Payer: BC Managed Care – PPO | Admitting: Podiatry

## 2012-12-14 VITALS — BP 112/65 | HR 74 | Resp 16 | Ht 66.0 in | Wt 149.0 lb

## 2012-12-14 VITALS — BP 110/70 | HR 61 | Temp 98.2°F | Ht 66.0 in | Wt 149.0 lb

## 2012-12-14 DIAGNOSIS — M549 Dorsalgia, unspecified: Secondary | ICD-10-CM

## 2012-12-14 DIAGNOSIS — K649 Unspecified hemorrhoids: Secondary | ICD-10-CM

## 2012-12-14 DIAGNOSIS — Z9889 Other specified postprocedural states: Secondary | ICD-10-CM

## 2012-12-14 DIAGNOSIS — K219 Gastro-esophageal reflux disease without esophagitis: Secondary | ICD-10-CM

## 2012-12-14 DIAGNOSIS — B1920 Unspecified viral hepatitis C without hepatic coma: Secondary | ICD-10-CM

## 2012-12-14 DIAGNOSIS — D696 Thrombocytopenia, unspecified: Secondary | ICD-10-CM

## 2012-12-14 DIAGNOSIS — Z23 Encounter for immunization: Secondary | ICD-10-CM

## 2012-12-14 DIAGNOSIS — F329 Major depressive disorder, single episode, unspecified: Secondary | ICD-10-CM

## 2012-12-14 MED ORDER — ESTROGENS, CONJUGATED 0.625 MG/GM VA CREA: TOPICAL_CREAM | VAGINAL | Status: DC

## 2012-12-14 MED ORDER — PANTOPRAZOLE SODIUM 40 MG PO TBEC: 40.0000 mg | DELAYED_RELEASE_TABLET | Freq: Every day | ORAL | Status: DC

## 2012-12-14 MED ORDER — ACYCLOVIR 400 MG PO TABS: 400.0000 mg | ORAL_TABLET | ORAL | Status: DC | PRN

## 2012-12-14 NOTE — Progress Notes (Signed)
Subjective:    Patient ID: Belinda Norman, female    DOB: 1960/03/21, 52 y.o.   MRN: [id number]  HPI 52 year old female with past history of hepatitis C, IBS and depression/anxiety who comes in today for a scheduled follow up.   Handling stress relatively well.  Feels she is doing better.   She has completed treatment for Hepatitis C.  States her viral load was undetectable.  She is in a study now.  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  Sees Dr Corky Sing at [facility].  Has been cured from chronic HCV.  She sees them every 6 months with labs and an ultrasound.  Sees Dr Troy Sine.   Just recently evaluated for a possible hemorrhoidal procedure.  It was decided to postpone the procedure since she is minimally symptomatic.  States overall she feels she is doing better.  No nausea or vomiting.  No bowel change.  Breathing stable.  Two months ago she had a neuroma removed from between her third and fourth toe.  She had to walk with a post op shoe.  She feels this aggravated her back.  Saw a Land.  Diagnosed with sciatica.  Sitting worse.  Went to [facility] in [place].  Was given valium and a shot of toradol.  The injection helped some.  She was taking advial (8/day) and this started irritating her stomach.  She is off now.  Is due to be evaluated at the [facility] 12/20/12 (Dr Verdie Mosher).  She is asking for something for pain today.  Better when she is walking.       Past Medical History  Diagnosis Date  . Hepatitis C   . HSV (herpes simplex virus) infection   . IBS (irritable bowel syndrome)     Current Outpatient Prescriptions on File Prior to Visit  Medication Sig Dispense Refill  . acyclovir (ZOVIRAX) 400 MG tablet Take 400 mg by mouth as needed. For outbreak      . citalopram (CELEXA) 40 MG tablet TAKE 1 TABLET (40 MG TOTAL) BY MOUTH DAILY.  90 tablet  0  . ibuprofen (ADVIL,MOTRIN) 800 MG tablet Take 800 mg by mouth every 8 (eight) hours as needed. pain      . pantoprazole (PROTONIX) 40 MG tablet Take 1  tablet (40 mg total) by mouth daily.  30 tablet  5  . promethazine (PHENERGAN) 25 MG tablet Take 1 tablet (25 mg total) by mouth every 8 (eight) hours as needed for nausea.  30 tablet  0   No current facility-administered medications on file prior to visit.    Review of Systems Patient denies any headache, lightheadedness or dizziness.  No sinus or allergy symptoms. No chest pain, tightness or palpitations.  No increased shortness of breath, cough or congestion.  No nausea or vomiting.   No abdominal pain or cramping.  Some stomach irritation with the increased advil. Off now.  Taking protonix.  No significant  bowel change, such as diarrhea, constipation, BRBPR or melana.  No urine change.   Handling stress relatively well.  Doing better.  Back/leg pain as outlined.       Objective:   Physical Exam  Filed Vitals:   12/14/12 0816  BP: 110/70  Pulse: 61  Temp: 98.2 F (15.41 C)   52 year old female in no acute distress.   HEENT:  Nares- clear.  Oropharynx - without lesions. NECK:  Supple.  Nontender.  No audible bruit.  HEART:  Appears to be regular. LUNGS:  No crackles or wheezing audible.  Respirations even and unlabored.  RADIAL PULSE:  Equal bilaterally.   ABDOMEN:  Soft, nontender.  Bowel sounds present and normal.  No audible abdominal bruit.    EXTREMITIES:  No increased edema present.  DP pulses palpable and equal bilaterally.              Assessment & Plan:  HEALTH MAINTENANCE. Physical 06/14/12.  Obtain mammogram results.  Had performed this year.

## 2012-12-14 NOTE — Progress Notes (Signed)
Belinda Norman presents today for followup of her release third intermetatarsal space of her left foot she doing quite well with this she says is still mildly tender in a different kind of sore.  Objective: I reviewed her past medical history medications and allergies. Pulses remain palpable left. She still has some tenderness on palpation to the deep intermetatarsal space third left. The incision has gone on to heal very nicely between the third and fourth toes.  Assessment: Well-healing neuroma surgery left.  Plan: Continue to massage the area and continue soaks. Continue to be conscious of shoe gear choices and followup with me in the near future should this not resolve. Otherwise she is released.

## 2012-12-14 NOTE — Progress Notes (Signed)
Pre-visit discussion using our clinic review tool. No additional management support is needed unless otherwise documented below in the visit note.  

## 2012-12-18 ENCOUNTER — Encounter: Payer: Self-pay | Admitting: Internal Medicine

## 2012-12-18 DIAGNOSIS — D696 Thrombocytopenia, unspecified: Secondary | ICD-10-CM | POA: Insufficient documentation

## 2012-12-18 DIAGNOSIS — M549 Dorsalgia, unspecified: Secondary | ICD-10-CM | POA: Insufficient documentation

## 2012-12-18 DIAGNOSIS — K649 Unspecified hemorrhoids: Secondary | ICD-10-CM | POA: Insufficient documentation

## 2012-12-18 NOTE — Assessment & Plan Note (Signed)
Has a history of depression/anxiety.  On Citalopram.  Have discussed counseling and referral to psychiatry.  Will notify me when agreeable.  She feels she is dong better.  Follow.

## 2012-12-18 NOTE — Assessment & Plan Note (Signed)
States she just had labs at [facility].  States platelet count was ok.  Need results.

## 2012-12-18 NOTE — Assessment & Plan Note (Signed)
On protonix.  Remain off increased antiinflammatories.   Continues to follow up with Dr Troy Sine.

## 2012-12-18 NOTE — Assessment & Plan Note (Signed)
S/P treatment.  Following with Dr Troy Sine.  Seeing Dr Corky Sing as outlined.  Has been cured of chronic HCV.  Continues to follow up with Dr Corky Sing q 6 months with labs and ultrasound.  Need lab results.

## 2012-12-18 NOTE — Assessment & Plan Note (Signed)
Recent flare as outlined.  Avoid increased antiinflammatories with her stomach issues.  Avoid narcotic medications, etc with her liver issues.  She has an appt with [facility] 12/20/12.  Feels she needs something prior to that appt.  Will place her on a medrol dose pack.  Discussed possible side effects and risk of steroid therapy.

## 2012-12-18 NOTE — Assessment & Plan Note (Signed)
Minimal symptoms.  Hold on further w/up or procedure at this time.  Follow.

## 2012-12-21 DIAGNOSIS — M533 Sacrococcygeal disorders, not elsewhere classified: Secondary | ICD-10-CM | POA: Insufficient documentation

## 2012-12-21 DIAGNOSIS — M47817 Spondylosis without myelopathy or radiculopathy, lumbosacral region: Secondary | ICD-10-CM | POA: Insufficient documentation

## 2013-01-23 ENCOUNTER — Encounter: Payer: Self-pay | Admitting: Internal Medicine

## 2013-01-23 NOTE — Progress Notes (Signed)
1. NEUROPLASTY 3RD INTERDIGITAL SPACE LEFT FOOT

## 2013-02-02 ENCOUNTER — Encounter: Payer: Self-pay | Admitting: Podiatry

## 2013-02-02 ENCOUNTER — Ambulatory Visit (INDEPENDENT_AMBULATORY_CARE_PROVIDER_SITE_OTHER): Payer: BC Managed Care – PPO

## 2013-02-02 ENCOUNTER — Ambulatory Visit (INDEPENDENT_AMBULATORY_CARE_PROVIDER_SITE_OTHER): Payer: BC Managed Care – PPO | Admitting: Podiatry

## 2013-02-02 VITALS — BP 114/70 | HR 63 | Resp 16 | Ht 66.0 in | Wt 145.0 lb

## 2013-02-02 DIAGNOSIS — M79673 Pain in unspecified foot: Secondary | ICD-10-CM

## 2013-02-02 DIAGNOSIS — M79609 Pain in unspecified limb: Secondary | ICD-10-CM

## 2013-02-02 DIAGNOSIS — S92309A Fracture of unspecified metatarsal bone(s), unspecified foot, initial encounter for closed fracture: Secondary | ICD-10-CM

## 2013-02-02 NOTE — Progress Notes (Signed)
Belinda Norman presents today for a chief complaint of a painful left foot. She states that over the weekend she had her foot beneath her while she was cleaning out the refrigerator. She's concerned that her neuroma has been reentered to the third interdigital space of the left foot. She states that she can hardly put the foot to the floor. She has severe pain on ambulation. She's currently getting facet injections to her back.  Objective: Vital signs are stable she is alert and oriented x3. She has pain on palpation to the fourth metatarsal of the left foot. No pain in the intermetatarsal space. Radiographic evaluation does not demonstrate any type of cortical abnormality as of yet however she has severe pain on turning for application to the fourth metatarsal. This is usually indicative of an endosteal stress reaction or a cortical stress fracture. Cutaneous evaluation is supple well hydrated cutis no erythema edema saline is drainage or odor other than after mentioned edema overlying the lesser metatarsals.  Assessment: Probable endosteal stress reaction or stress fracture fourth metatarsal of the left foot. We dispensed to Darco shoes today and we will rex-ray her left foot in 2-3 weeks.

## 2013-02-08 ENCOUNTER — Ambulatory Visit: Payer: BC Managed Care – PPO | Admitting: Podiatry

## 2013-02-16 ENCOUNTER — Encounter: Payer: Self-pay | Admitting: Podiatry

## 2013-02-16 ENCOUNTER — Ambulatory Visit (INDEPENDENT_AMBULATORY_CARE_PROVIDER_SITE_OTHER): Payer: BC Managed Care – PPO

## 2013-02-16 ENCOUNTER — Ambulatory Visit: Payer: BC Managed Care – PPO

## 2013-02-16 ENCOUNTER — Ambulatory Visit (INDEPENDENT_AMBULATORY_CARE_PROVIDER_SITE_OTHER): Payer: BC Managed Care – PPO | Admitting: Podiatry

## 2013-02-16 VITALS — BP 106/61 | HR 58 | Resp 16 | Ht 66.0 in | Wt 146.0 lb

## 2013-02-16 DIAGNOSIS — M79609 Pain in unspecified limb: Secondary | ICD-10-CM

## 2013-02-16 DIAGNOSIS — M79673 Pain in unspecified foot: Secondary | ICD-10-CM

## 2013-02-16 DIAGNOSIS — M778 Other enthesopathies, not elsewhere classified: Secondary | ICD-10-CM

## 2013-02-16 DIAGNOSIS — M7750 Other enthesopathy of unspecified foot: Secondary | ICD-10-CM

## 2013-02-16 DIAGNOSIS — M779 Enthesopathy, unspecified: Secondary | ICD-10-CM

## 2013-02-16 MED ORDER — METHYLPREDNISOLONE (PAK) 4 MG PO TABS: ORAL_TABLET | ORAL | Status: DC

## 2013-02-16 NOTE — Progress Notes (Signed)
She presents today for followup of an intramedullary stress reaction fourth metatarsal of the left foot. She states it feels good as long some wearing the boot.  Objective: Vital signs are stable she is alert and oriented x3. Pulses are palpable left lower extremity. She has mild edema and mild ecchymosis overlying the second third and fourth metatarsophalangeal joints. She still has pain on palpation fourth metatarsal shaft left foot pain on end range of motion of the second third and fourth metatarsophalangeal joints left foot.  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 Capsulitis second third fourth metatarsal phalangeal joints of the left foot.  Plan: Continue the use of the Darco shoe and added Medrol Dosepak. Followup with her in 3-4

## 2013-03-07 ENCOUNTER — Encounter: Payer: Self-pay | Admitting: Internal Medicine

## 2013-03-08 ENCOUNTER — Ambulatory Visit: Payer: BC Managed Care – PPO | Admitting: Podiatry

## 2013-03-10 ENCOUNTER — Ambulatory Visit: Payer: Self-pay | Admitting: Internal Medicine

## 2013-03-15 ENCOUNTER — Ambulatory Visit: Payer: BC Managed Care – PPO | Admitting: Podiatry

## 2013-03-18 ENCOUNTER — Other Ambulatory Visit: Payer: Self-pay | Admitting: Internal Medicine

## 2013-03-23 ENCOUNTER — Other Ambulatory Visit: Payer: Self-pay | Admitting: Internal Medicine

## 2013-03-23 ENCOUNTER — Telehealth: Payer: Self-pay | Admitting: *Deleted

## 2013-03-23 NOTE — Telephone Encounter (Signed)
Pt called and spoke with shelly wanting appt asap with you. Said she had to cancel her other appts due to snow and having the flu. Shelly made her appt with dr Paulla Dolly for tues 3.24.15 and told her your first available would be 3.30.15. Pt left me a voicemail wanting me to make her appt with you asap because she does not want to see another doctor. Pt states her foot is killing her. Would you like to see her asap or let her see another doctor?

## 2013-03-23 NOTE — Telephone Encounter (Signed)
Because of the heavy schedule we could be on the waiting list or the 3/23 or there about.

## 2013-03-31 ENCOUNTER — Ambulatory Visit: Payer: BC Managed Care – PPO | Admitting: Podiatry

## 2013-04-10 ENCOUNTER — Ambulatory Visit (INDEPENDENT_AMBULATORY_CARE_PROVIDER_SITE_OTHER): Payer: BC Managed Care – PPO | Admitting: Podiatry

## 2013-04-10 ENCOUNTER — Ambulatory Visit (INDEPENDENT_AMBULATORY_CARE_PROVIDER_SITE_OTHER): Payer: BC Managed Care – PPO

## 2013-04-10 ENCOUNTER — Encounter: Payer: Self-pay | Admitting: Podiatry

## 2013-04-10 VITALS — BP 100/62 | HR 63 | Resp 16

## 2013-04-10 DIAGNOSIS — M79672 Pain in left foot: Secondary | ICD-10-CM

## 2013-04-10 DIAGNOSIS — M79609 Pain in unspecified limb: Secondary | ICD-10-CM

## 2013-04-10 DIAGNOSIS — G5760 Lesion of plantar nerve, unspecified lower limb: Secondary | ICD-10-CM

## 2013-04-10 DIAGNOSIS — G588 Other specified mononeuropathies: Secondary | ICD-10-CM

## 2013-04-10 NOTE — Progress Notes (Signed)
She presents today continue complaint of pain to the dorsal lateral the lateral aspect of her left foot. The majority of her symptoms are located within the fourth intermetatarsal space and third intermetatarsal space of the left foot.  Objective: Pulses are stable she is alert and oriented x3. Radiographs are negative area she has pain on palpation to the third interdigital space of the left foot.  Assessment: Neuroma third interdigital space left.  Plan: I injected Kenalog today with local anesthetic to the point of maximal tenderness today. If this does not resolve an MRI will be necessary.

## 2013-04-12 ENCOUNTER — Ambulatory Visit: Payer: BC Managed Care – PPO | Admitting: Podiatry

## 2013-04-20 ENCOUNTER — Encounter: Payer: Self-pay | Admitting: Podiatry

## 2013-04-20 NOTE — Progress Notes (Signed)
auth # [id number] for mri scheduled on 4.13.15. Expires 5.7.15.

## 2013-04-24 ENCOUNTER — Ambulatory Visit: Payer: Self-pay | Admitting: Podiatry

## 2013-04-24 ENCOUNTER — Ambulatory Visit: Payer: BC Managed Care – PPO | Admitting: Podiatry

## 2013-04-25 ENCOUNTER — Encounter: Payer: Self-pay | Admitting: Podiatry

## 2013-04-25 ENCOUNTER — Telehealth: Payer: Self-pay | Admitting: Podiatry

## 2013-04-25 NOTE — Telephone Encounter (Signed)
Left message for patient to schedule an appointment for MRI results 

## 2013-05-03 ENCOUNTER — Ambulatory Visit (INDEPENDENT_AMBULATORY_CARE_PROVIDER_SITE_OTHER): Payer: BC Managed Care – PPO | Admitting: Podiatry

## 2013-05-03 VITALS — Resp 16 | Ht 67.0 in | Wt 150.0 lb

## 2013-05-03 DIAGNOSIS — M778 Other enthesopathies, not elsewhere classified: Secondary | ICD-10-CM

## 2013-05-03 DIAGNOSIS — M779 Enthesopathy, unspecified: Secondary | ICD-10-CM

## 2013-05-03 DIAGNOSIS — M7750 Other enthesopathy of unspecified foot: Secondary | ICD-10-CM

## 2013-05-03 DIAGNOSIS — G588 Other specified mononeuropathies: Secondary | ICD-10-CM

## 2013-05-03 DIAGNOSIS — G5760 Lesion of plantar nerve, unspecified lower limb: Secondary | ICD-10-CM

## 2013-05-03 MED ORDER — OXYCODONE-ACETAMINOPHEN 10-325 MG PO TABS: ORAL_TABLET | ORAL | Status: DC

## 2013-05-03 NOTE — Progress Notes (Signed)
She presents today for followup of her MRI which did come back positive for neuroma third interspace of the left foot.  Objective: Pulses remain palpable left foot she has pain on end range of motion of the second metatarsophalangeal joint left foot and she has pain on palpation to the third interdigital space of the left foot.  Assessment: Capsulitis second metatarsophalangeal joint left. Neuroma third interspace left.  Assessment: We went over consent form today line bylined number by number giving her ample time to ask she saw fit regarding a excision of neuroma third interdigital space of the left foot. And a FluoroScan guided intra-articular injection of Kenalog at the second metatarsophalangeal joint. I answered all the questions regarding these procedures the best of my ability in layman's terms. I will followup with her in the near future for surgery.

## 2013-05-11 ENCOUNTER — Other Ambulatory Visit: Payer: Self-pay | Admitting: Podiatry

## 2013-05-11 MED ORDER — PROMETHAZINE HCL 12.5 MG PO TABS: 25.0000 mg | ORAL_TABLET | Freq: Four times a day (QID) | ORAL | Status: DC | PRN

## 2013-05-11 MED ORDER — OXYCODONE-ACETAMINOPHEN 10-325 MG PO TABS: ORAL_TABLET | ORAL | Status: DC

## 2013-05-11 MED ORDER — CEPHALEXIN 500 MG PO CAPS: 500.0000 mg | ORAL_CAPSULE | Freq: Three times a day (TID) | ORAL | Status: DC

## 2013-05-12 ENCOUNTER — Encounter: Payer: Self-pay | Admitting: Podiatry

## 2013-05-12 DIAGNOSIS — M722 Plantar fascial fibromatosis: Secondary | ICD-10-CM

## 2013-05-12 DIAGNOSIS — G5760 Lesion of plantar nerve, unspecified lower limb: Secondary | ICD-10-CM

## 2013-05-16 ENCOUNTER — Telehealth: Payer: Self-pay | Admitting: *Deleted

## 2013-05-16 NOTE — Telephone Encounter (Signed)
Called and left message for pt to return call regarding surgery questions.

## 2013-05-17 ENCOUNTER — Other Ambulatory Visit: Payer: Self-pay | Admitting: *Deleted

## 2013-05-17 ENCOUNTER — Telehealth: Payer: Self-pay | Admitting: *Deleted

## 2013-05-17 NOTE — Telephone Encounter (Signed)
Called Belinda Norman to check on her to see how she was doing from surgery . She said she was doing fine, and will be here tomorrow in the office

## 2013-05-17 NOTE — Progress Notes (Signed)
1. Excision neuroma 3rd interdigital space left foot. 2. Injection 2nd mtpj left foot.

## 2013-05-18 ENCOUNTER — Ambulatory Visit (INDEPENDENT_AMBULATORY_CARE_PROVIDER_SITE_OTHER): Payer: BC Managed Care – PPO | Admitting: Podiatry

## 2013-05-18 ENCOUNTER — Encounter: Payer: Self-pay | Admitting: Podiatry

## 2013-05-18 VITALS — BP 126/77 | HR 64 | Temp 98.9°F | Resp 16 | Wt 150.0 lb

## 2013-05-18 DIAGNOSIS — Z9889 Other specified postprocedural states: Secondary | ICD-10-CM

## 2013-05-19 NOTE — Progress Notes (Signed)
She presents today for followup of excision neuroma third interdigital space of the left foot an injection to the second metatarsophalangeal joint left foot. She denies fever chills nausea vomiting muscle aches and pains. States she's doing quite well with this. She has had very little pain.  Objective: Vital signs are stable she is alert and oriented x3. Dry sterile dressing was removed demonstrates no erythema edema cellulitis drainage or odor margins appear to be well coapted and she has good pain-free range of motion of the second metatarsophalangeal joint left.  Assessment: Well-healing surgical foot left.  Plan: Followup with her in one week for suture removal.

## 2013-05-25 ENCOUNTER — Ambulatory Visit (INDEPENDENT_AMBULATORY_CARE_PROVIDER_SITE_OTHER): Payer: BC Managed Care – PPO | Admitting: Podiatry

## 2013-05-25 VITALS — BP 118/74 | HR 65 | Resp 16

## 2013-05-25 DIAGNOSIS — Z9889 Other specified postprocedural states: Secondary | ICD-10-CM

## 2013-05-25 NOTE — Progress Notes (Signed)
Belinda Norman presents today for followup of excision of neuroma left foot. She is now status post 2 weeks. She presents once again with the dressing removed that I had placed last week she states that the incision is red and tender. She also states that she went to the beach last week.  Objective: Vital signs are stable she is alert and oriented x3. The incision site appears to be well coapted sutures are intact but is erythematous and tender.  Assessment: Excision neuroma left foot third interdigital space x2 weeks ago.  Plan: Sutures were removed today. Steri-Strips were placed. I would allow her to start getting this wet. I will followup with her in 2 weeks.

## 2013-06-06 ENCOUNTER — Encounter: Payer: Self-pay | Admitting: Podiatry

## 2013-06-08 ENCOUNTER — Encounter: Payer: Self-pay | Admitting: Podiatrist

## 2013-06-08 ENCOUNTER — Ambulatory Visit: Payer: BC Managed Care – PPO | Admitting: Podiatrist

## 2013-06-08 VITALS — BP 98/63 | HR 66 | Resp 16

## 2013-06-08 DIAGNOSIS — Z9889 Other specified postprocedural states: Secondary | ICD-10-CM

## 2013-06-08 DIAGNOSIS — G588 Other specified mononeuropathies: Secondary | ICD-10-CM

## 2013-06-08 DIAGNOSIS — G5760 Lesion of plantar nerve, unspecified lower limb: Secondary | ICD-10-CM

## 2013-06-08 DIAGNOSIS — M79609 Pain in unspecified limb: Secondary | ICD-10-CM

## 2013-06-08 NOTE — Progress Notes (Signed)
Belinda Norman presents today for followup of excision of neuroma left foot. She is now 4 weeks status post surgery. She presents today stating the foot is still tender and sore but the pain she had before surgery is gone and the foot feels much better.  Overall she is pleased with her progress.  Objective: Vital signs are stable she is alert and oriented x3. The incision site appears to be well coapted - mild pinkish color of the incision itself noted.  Interdigitally the skin is still a bit cracking but overall no signs of infection present.   Assessment: Excision neuroma left foot third interdigital space x 4 weeks  Plan: instructed her to massage the incision line with a moisturizing cream to soften the skin and prevent scar tissue from forming.  She is to continue to wear a wide toe boxed shoe.  She will be seen back for a recheck in 1 month with Dr. Milinda Pointer.  If any problems or concerns arise she will call.

## 2013-06-16 ENCOUNTER — Encounter: Payer: BC Managed Care – PPO | Admitting: Internal Medicine

## 2013-07-03 ENCOUNTER — Encounter: Payer: Self-pay | Admitting: Podiatry

## 2013-07-12 ENCOUNTER — Encounter: Payer: Self-pay | Admitting: Podiatry

## 2013-07-12 ENCOUNTER — Ambulatory Visit (INDEPENDENT_AMBULATORY_CARE_PROVIDER_SITE_OTHER): Payer: BC Managed Care – PPO | Admitting: Podiatry

## 2013-07-12 DIAGNOSIS — Z9889 Other specified postprocedural states: Secondary | ICD-10-CM

## 2013-07-12 NOTE — Progress Notes (Signed)
She presents today for a followup of her neuroma excision left foot. She states it is doing quite well however it does bother me occasionally. She presents today in a pair of flip-flops.  Objective: Vital signs are stable she is alert and oriented x3. She has some tenderness on palpation to the third interdigital space of the left foot where the scar does appear to be tight temporally down the foot. Tenderness on palpation of the second metatarsophalangeal joint.  Assessment: Well-healing surgical foot left status post neurectomy third interdigital space. Capsulitis second metatarsophalangeal joint right foot.  Plan: Continue all conservative therapies followup with me in the near future. We discussed appropriate shoe gear stretching exercises and ice therapy regarding her second metatarsophalangeal joint which is somewhat tender to her.

## 2013-07-17 ENCOUNTER — Encounter: Payer: Self-pay | Admitting: Internal Medicine

## 2013-07-17 DIAGNOSIS — K219 Gastro-esophageal reflux disease without esophagitis: Secondary | ICD-10-CM

## 2013-08-22 ENCOUNTER — Other Ambulatory Visit: Payer: Self-pay | Admitting: Gastroenterology

## 2013-08-22 DIAGNOSIS — C220 Liver cell carcinoma: Secondary | ICD-10-CM

## 2013-08-30 DIAGNOSIS — E8940 Asymptomatic postprocedural ovarian failure: Secondary | ICD-10-CM | POA: Insufficient documentation

## 2013-08-30 DIAGNOSIS — M810 Age-related osteoporosis without current pathological fracture: Secondary | ICD-10-CM | POA: Insufficient documentation

## 2013-09-06 ENCOUNTER — Ambulatory Visit: Payer: Self-pay

## 2013-09-11 ENCOUNTER — Ambulatory Visit: Payer: Self-pay

## 2013-09-13 ENCOUNTER — Ambulatory Visit: Payer: Self-pay

## 2013-09-19 ENCOUNTER — Telehealth: Payer: Self-pay | Admitting: *Deleted

## 2013-09-19 NOTE — Telephone Encounter (Signed)
Received referral from [facility]. Left vm for pt to return call to discuss Melbeta. Contact information given.

## 2013-09-21 LAB — PATHOLOGY REPORT

## 2013-09-27 DIAGNOSIS — C50919 Malignant neoplasm of unspecified site of unspecified female breast: Secondary | ICD-10-CM | POA: Insufficient documentation

## 2013-10-11 ENCOUNTER — Ambulatory Visit (INDEPENDENT_AMBULATORY_CARE_PROVIDER_SITE_OTHER): Payer: BC Managed Care – PPO

## 2013-10-11 ENCOUNTER — Encounter: Payer: Self-pay | Admitting: Podiatry

## 2013-10-11 ENCOUNTER — Ambulatory Visit (INDEPENDENT_AMBULATORY_CARE_PROVIDER_SITE_OTHER): Payer: BC Managed Care – PPO | Admitting: Podiatry

## 2013-10-11 VITALS — BP 119/72 | HR 68 | Temp 98.1°F | Resp 13 | Ht 67.0 in | Wt 156.0 lb

## 2013-10-11 DIAGNOSIS — M79672 Pain in left foot: Secondary | ICD-10-CM

## 2013-10-11 DIAGNOSIS — M79609 Pain in unspecified limb: Secondary | ICD-10-CM

## 2013-10-11 DIAGNOSIS — L03119 Cellulitis of unspecified part of limb: Secondary | ICD-10-CM

## 2013-10-11 DIAGNOSIS — L02619 Cutaneous abscess of unspecified foot: Secondary | ICD-10-CM

## 2013-10-11 LAB — SEDIMENTATION RATE: Sed Rate: 10 mm/hr (ref 0–22)

## 2013-10-11 LAB — C-REACTIVE PROTEIN: CRP: <0.5 mg/dL (ref <0.60)

## 2013-10-11 LAB — CBC WITH DIFFERENTIAL/PLATELET
Basophils Absolute: 0 10*3/uL (ref 0.0–0.1)
Basophils Relative: 0 % (ref 0–1)
EOS PCT: 2 % (ref 0–5)
Eosinophils Absolute: 0.2 10*3/uL (ref 0.0–0.7)
HCT: 40.3 % (ref 36.0–46.0)
Hemoglobin: 14.3 g/dL (ref 12.0–15.0)
LYMPHS ABS: 0.8 10*3/uL (ref 0.7–4.0)
LYMPHS PCT: 10 % — AB (ref 12–46)
MCH: 30.6 pg (ref 26.0–34.0)
MCHC: 35.5 g/dL (ref 30.0–36.0)
MCV: 86.3 fL (ref 78.0–100.0)
MONO ABS: 0.7 10*3/uL (ref 0.1–1.0)
Monocytes Relative: 8 % (ref 3–12)
Neutro Abs: 6.7 10*3/uL (ref 1.7–7.7)
Neutrophils Relative %: 80 % — ABNORMAL HIGH (ref 43–77)
PLATELETS: 117 10*3/uL — AB (ref 150–400)
RBC: 4.67 MIL/uL (ref 3.87–5.11)
RDW: 13.7 % (ref 11.5–15.5)
WBC: 8.4 10*3/uL (ref 4.0–10.5)

## 2013-10-11 MED ORDER — CIPROFLOXACIN HCL 500 MG PO TABS: 500.0000 mg | ORAL_TABLET | Freq: Two times a day (BID) | ORAL | Status: DC

## 2013-10-11 MED ORDER — CLINDAMYCIN HCL 300 MG PO CAPS: 300.0000 mg | ORAL_CAPSULE | Freq: Three times a day (TID) | ORAL | Status: DC

## 2013-10-11 MED ORDER — OXYCODONE-ACETAMINOPHEN 5-325 MG PO TABS: 1.0000 | ORAL_TABLET | Freq: Four times a day (QID) | ORAL | Status: DC | PRN

## 2013-10-11 NOTE — Patient Instructions (Signed)
Monitor for any signs/symptoms of worseing infection. Call the office immediately if any occur or go directly to the emergency room. Call with any questions/concerns.

## 2013-10-11 NOTE — Progress Notes (Signed)
   Subjective:    Patient ID: Belinda Norman, female    DOB: 1960/01/21, 53 y.o.   MRN: [id number]  HPI Comments: Belinda Norman, 53 year old female, returns the office today with a one-day history of increasing pain, erythema to her left foot. She previously has undergone a neuroma excision her left third interspace on 05/12/2013. She states that since the surgery she has had mild swelling. She is an overlying hammertoe deformity over her third digit. She states that she changes shoes approximately 2 weeks ago she developed a blister on the dorsal aspect of her third digit. She states that yesterday she went into a regular shoe which she did not feel as tight or causing any increased pain. Yesterday she developed increasing pain, erythema, warmth to her left foot. She has difficulty with weightbearing as there is pain into the ball of her foot. She denies any systemic complaints as fevers, chills, nausea, vomiting. No other complaints at this time.  She was recently diagnosed with breast cancer she's going next week to determine treatment options.  Foot Pain      Review of Systems  Genitourinary:       Diaqgnosed with breast cancer recently.  Musculoskeletal:       Osteoporosis  All other systems reviewed and are negative.      Objective:   Physical Exam AAO x3, NAD DP/PT pulses palpable 2/4 b/l. CRT < sec Protective sensation intact with Derrel Nip monofilament, vibratory sensation intact. Scar over third interspace left foot well healed at this time. Is no open lesions. There is edema into the third interspace dorsally and plantarly. There is erythema overlying the third digit over the area of the previous blister formation extending into the third interspace with a small amount of streaking to the dorsal foot and he just distal to the ankle. There is erythema to the plantar aspect of the third interspace extending just proximal to the MPJ. There is no pain with range of motion  of the MPJs or ankle. There is no evidence of fluctuance or crepitus. On the plantar aspect of the foot there is a localized increase amount of edema however currently there is no fluctuant mass or drainable collection identified. Small blister/scab dorsal aspect of the third digit upon debridement no underlying purulence. No calf pain, swelling, warmth.      Assessment & Plan:  53 year old female with left foot cellulitis. -Conservative versus surgical treatment were discussed including alternatives, risks, complications. -Cellulitis area was marked. -Start clindamycin and ciprofloxacin. -Prescribed Percocet. Although she has a codeine allergy she states that she is taking Percocet without any difficulties. -Ordered CBC, ESR, CRP. -Dispense cam boot due to the pain. -Monitor these worsening signs or symptoms of infection and directed to call the office immediately if any are to occur or go directly to the emergency room. The area of cellulitis is more than her skin. She was informed that if the cellulitic area extends past the mark to call the office immediately or go to the ER. -Followup on Monday or sooner any problems are to arise or any changes symptoms. In the meantime call any questions, concerns, change in symptoms.

## 2013-10-15 ENCOUNTER — Telehealth: Payer: Self-pay | Admitting: Podiatry

## 2013-10-15 NOTE — Telephone Encounter (Signed)
Patient called Thursday, 10/12/2013 to the [place] office and spoke with Jenny Reichmann around 8:30am. She informed me that the patient called stating that the infection had worsened. I recommended that the patient go directly to the emergency room for which [facility] relayed to the patient.

## 2013-10-16 ENCOUNTER — Ambulatory Visit: Payer: BC Managed Care – PPO | Admitting: Podiatry

## 2015-03-25 IMAGING — MG MM DIGITAL SCREENING BILAT W/ CAD
1 series · 4 of 4 positions shown · non-contrast
Comparison: Previous exam(s)

CLINICAL DATA: Screening.

EXAM:
DIGITAL SCREENING BILATERAL MAMMOGRAM WITH CAD

[R CC · right · 4 of 4 slices shown]
[im 1/4]
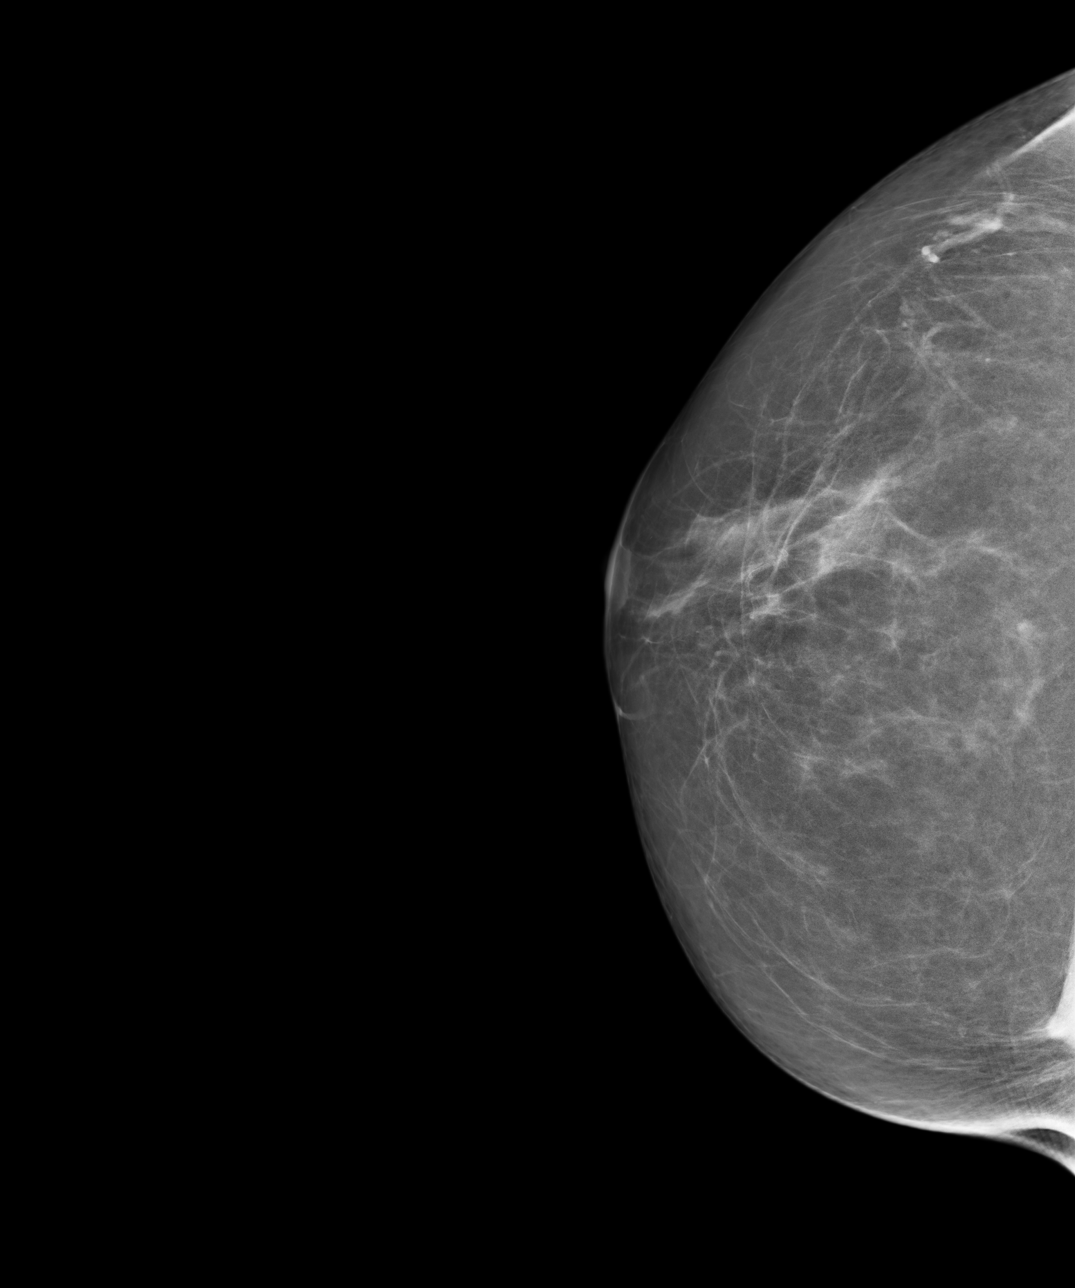
[im 2/4]
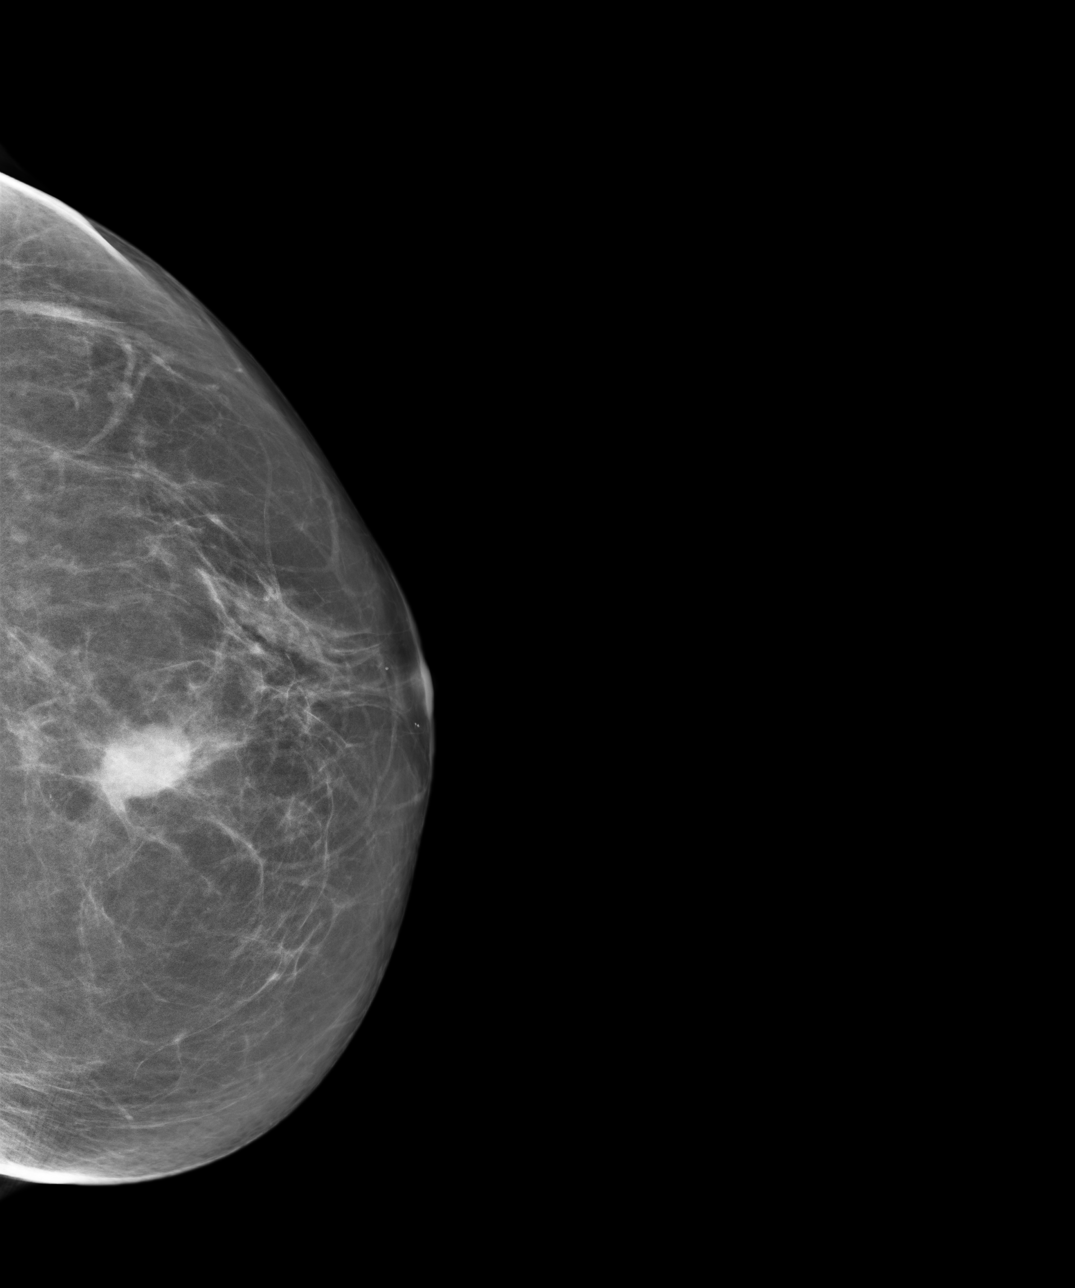
[im 3/4]
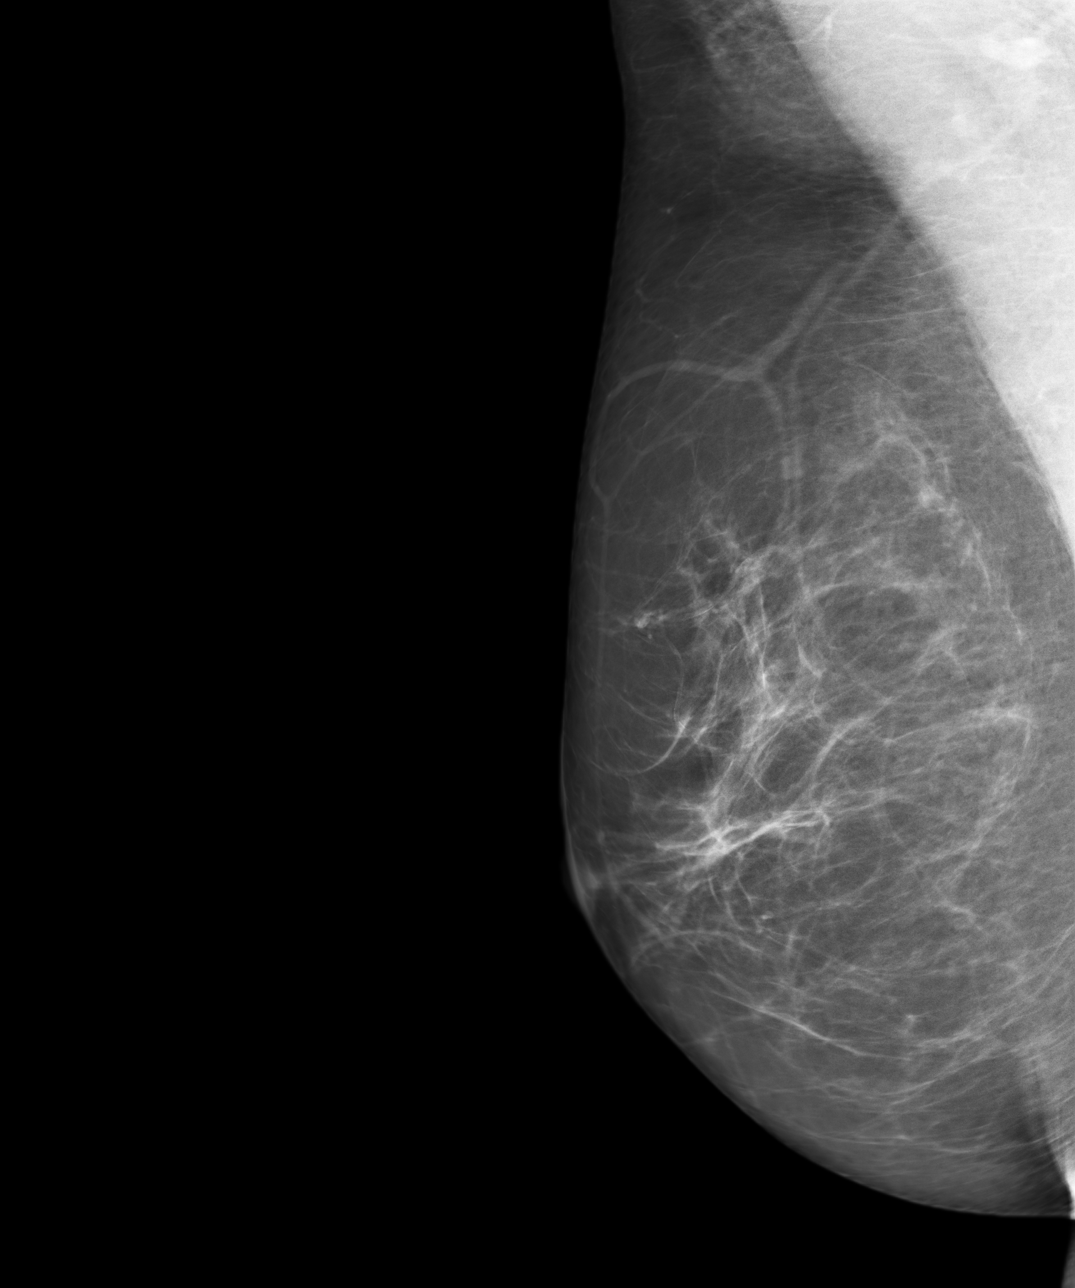
[im 4/4]
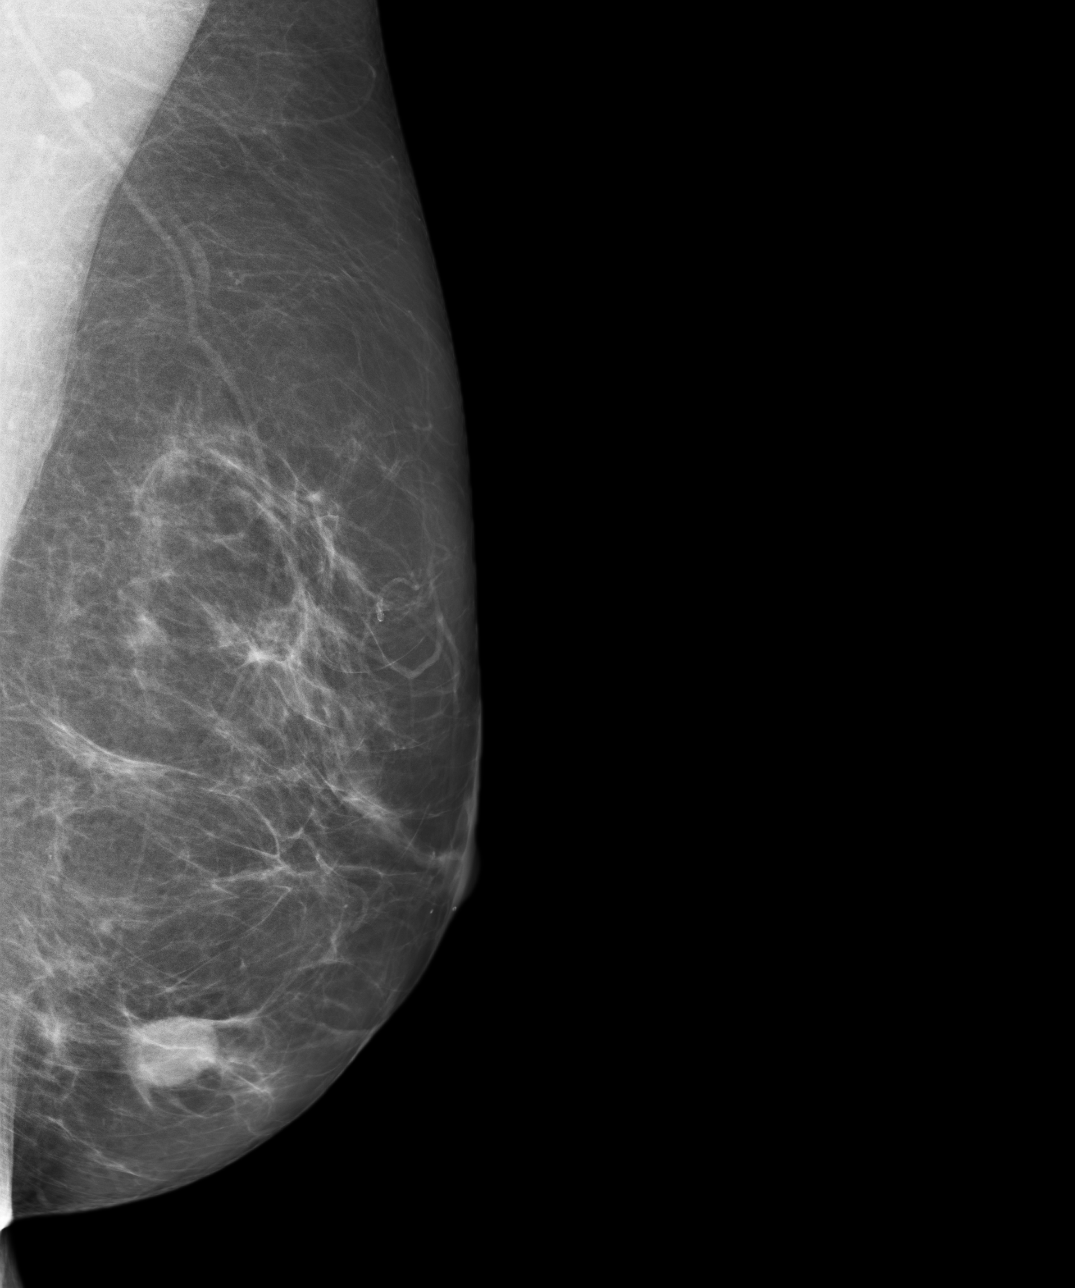

[4 of 4 positions shown; findings below may reference images not displayed]

ACR Breast Density Category b: There are scattered areas of
fibroglandular density.
FINDINGS: In the left breast, a possible mass warrants further evaluation with
spot compression views and possibly ultrasound. In the right breast,
no findings suspicious for malignancy.

Images were processed with CAD.
IMPRESSION: Further evaluation is suggested for possible mass in the left
breast.

RECOMMENDATION:
Diagnostic mammogram and possibly ultrasound of the left breast.
(Code:[id number]-[id number])

The patient will be contacted regarding the findings, and additional
imaging will be scheduled.

BI-RADS CATEGORY  0: Incomplete. Need additional imaging evaluation
and/or prior mammograms for comparison.

## 2015-03-30 IMAGING — US US BREAST*L* LIMITED INC AXILLA
1 series · 7 of 7 positions shown · non-contrast
Comparison: 09/07/2013 and earlier

CLINICAL DATA: The patient returns after screening study for
evaluation of possible mass in the left breast.

EXAM:
DIGITAL DIAGNOSTIC  LEFT MAMMOGRAM
ULTRASOUND LEFT BREAST

[Series 1: us breast*left* limited inc axilla · 0.08mm/px · 7 of 7 slices shown]
[im 1/7]
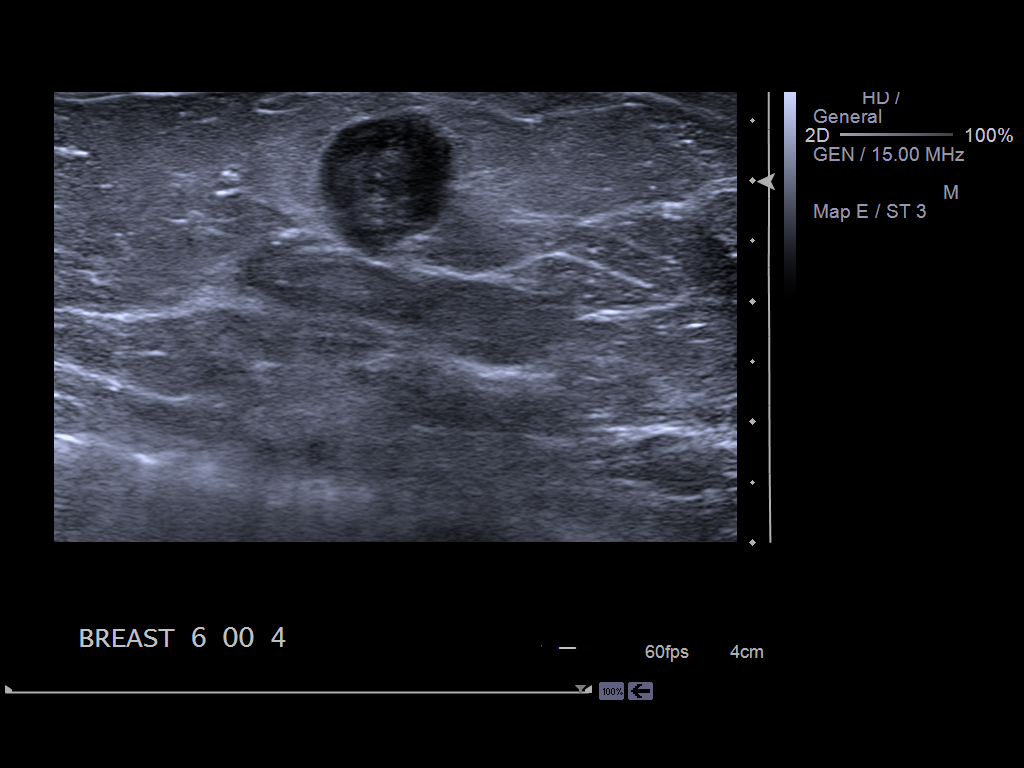
[im 2/7]
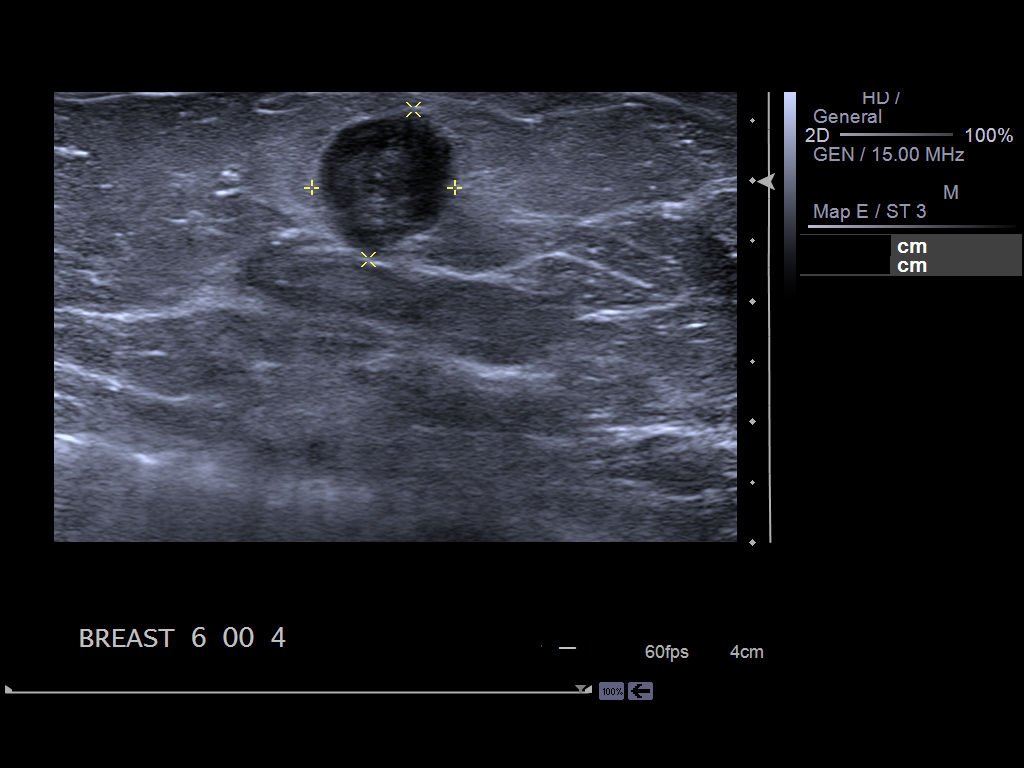
[im 3/7]
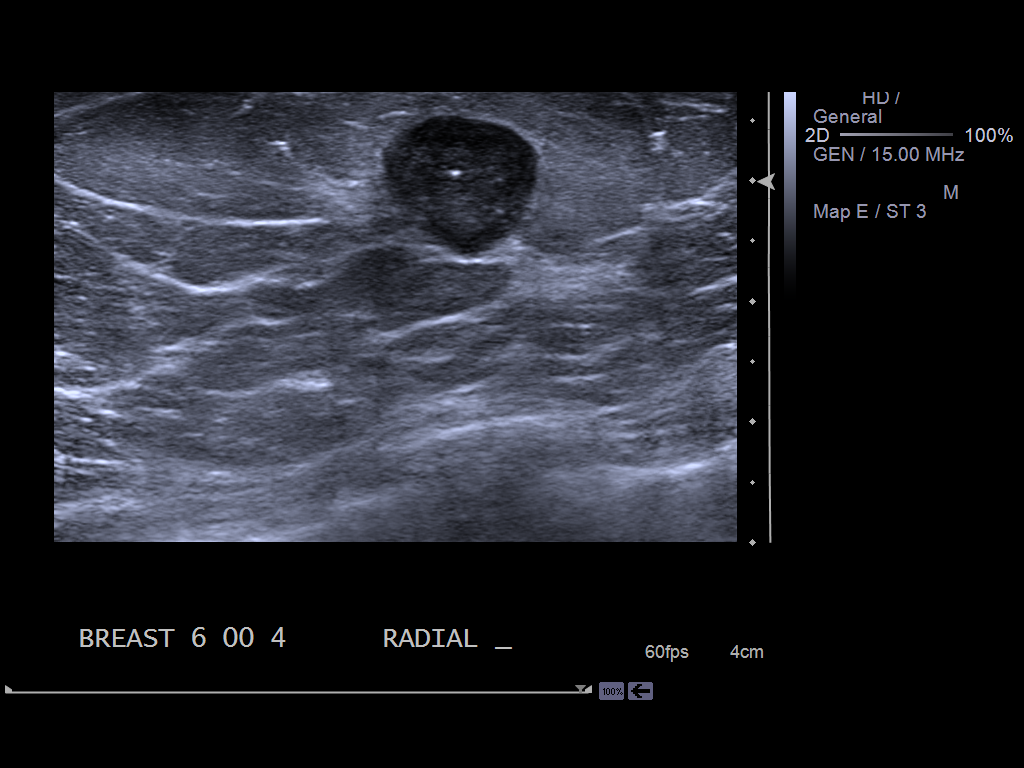
[im 4/7]
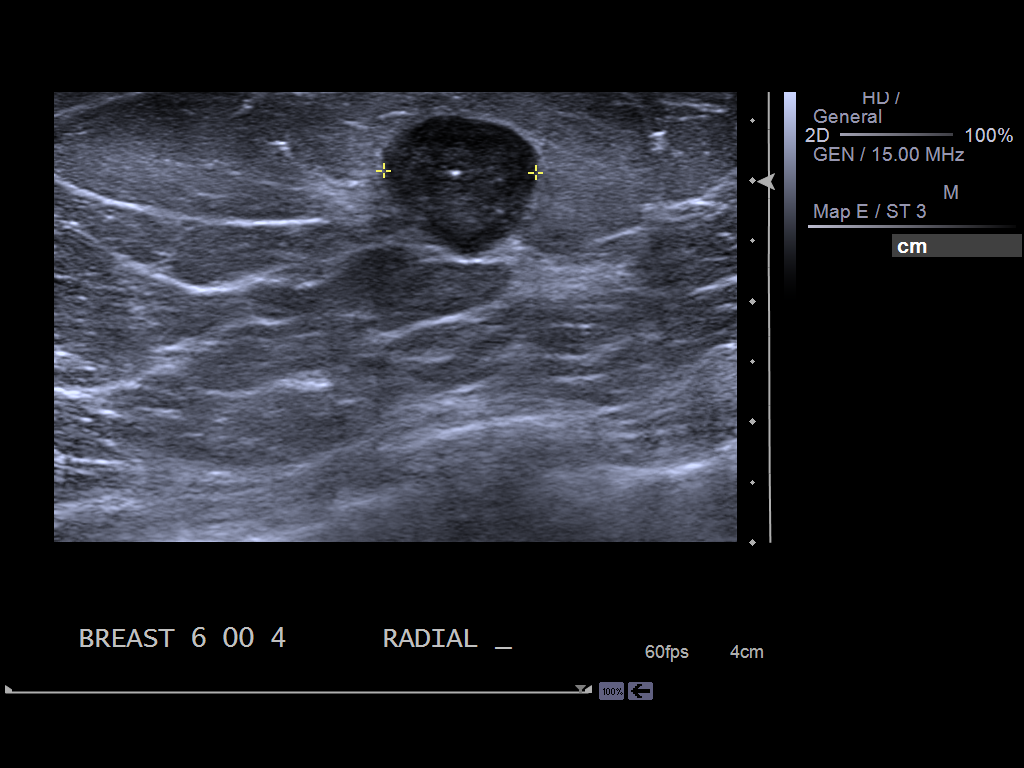
[im 5/7]
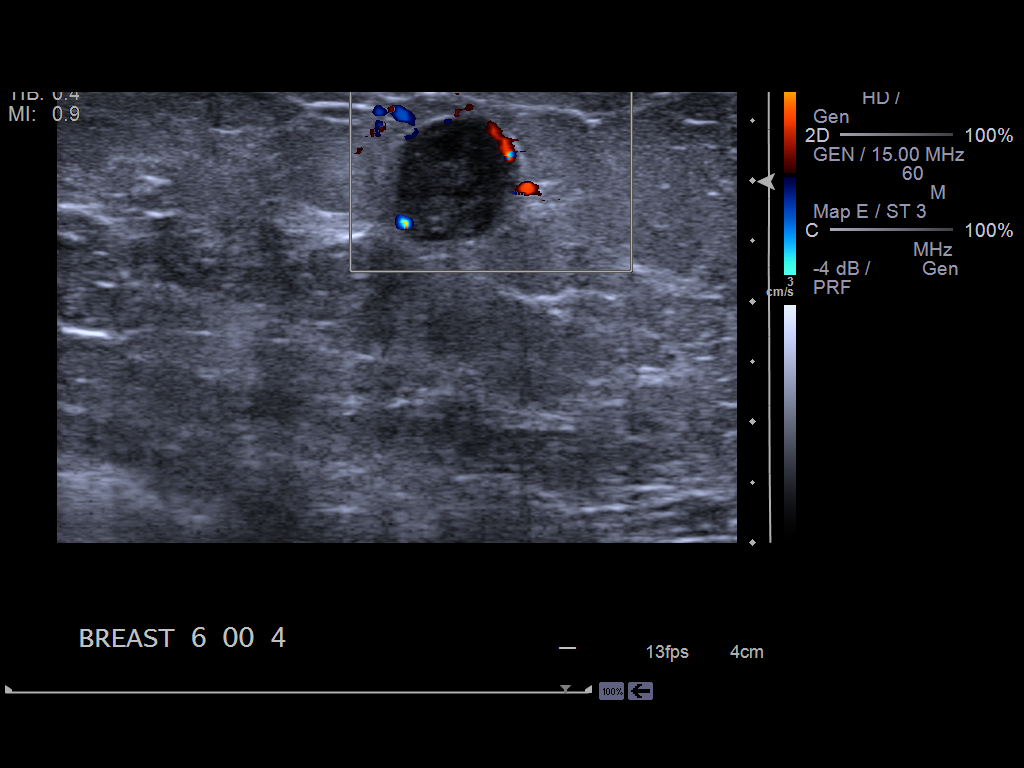
[im 6/7]
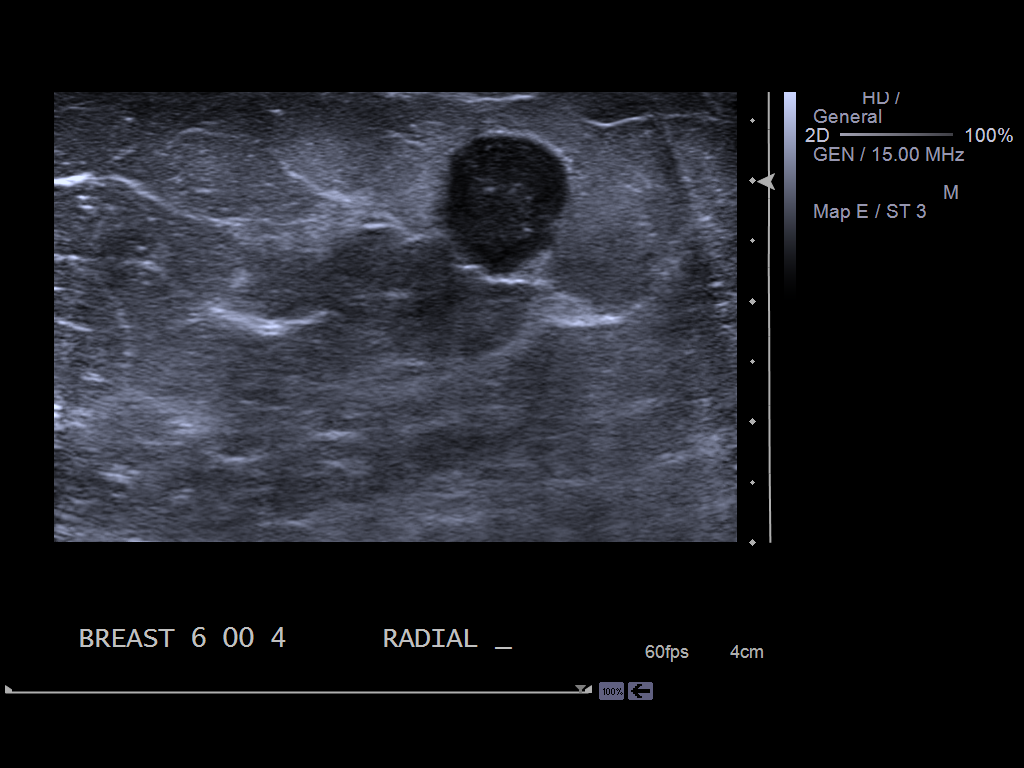
[im 7/7]
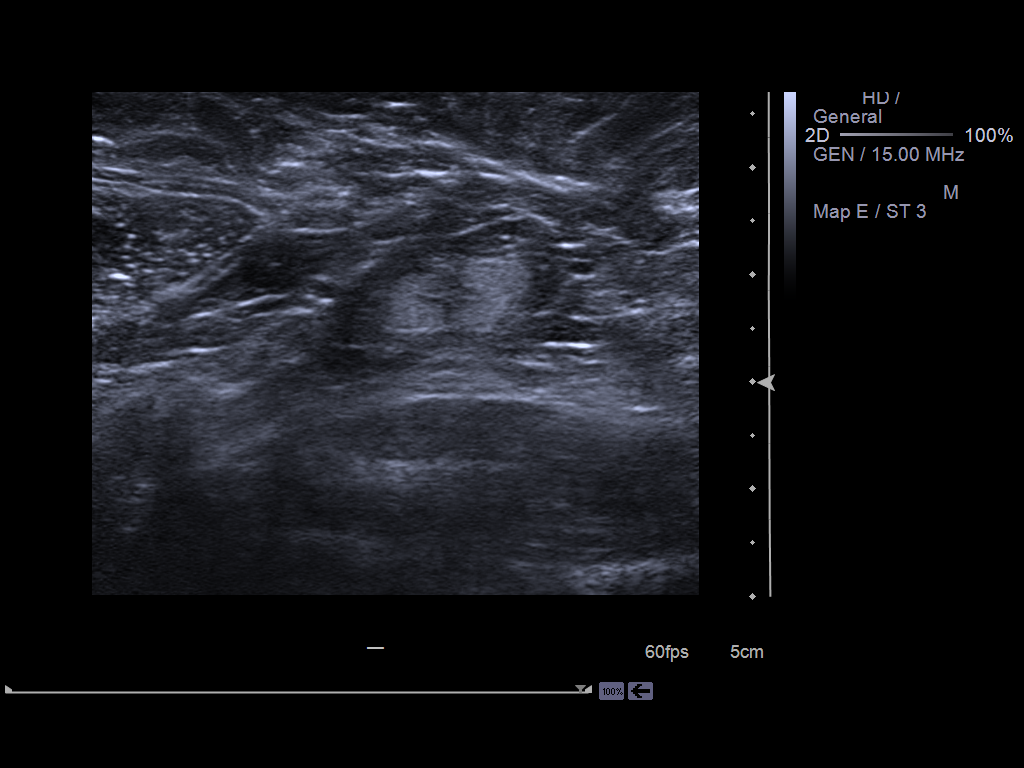

[7 of 7 positions shown; findings below may reference images not displayed]

ACR Breast Density Category b: There are scattered areas of
fibroglandular density.
FINDINGS: Spot compression views confirm presence of an oval mass with some
spiculated margins in the lower central portion of the left breast.
During the exam the abnormality is noted to be palpable by the
technologist and also reported to be palpable by the patient.

On physical exam, I palpate a rounded mobile nodule in the 6 o'clock
location of the left breast in the area of patient's concern.

Ultrasound is performed, showing a taller than wide oval mass in the
6 o'clock location of the left breast 4 cm from the nipple. This
measures 1.2 x 1.3 x 1.3 cm. Although most margins are circumscribed
some margins are ill-defined. There is internal blood flow on
Doppler evaluation. Evaluation of the left axilla is negative for
adenopathy.
IMPRESSION: Suspicious mass the 6 o'clock location of the left breast.

RECOMMENDATION:
Ultrasound-guided core biopsy is recommended.

I have discussed the findings and recommendations with the patient.
Results were also provided in writing at the conclusion of the
visit. If applicable, a reminder letter will be sent to the patient
regarding the next appointment.

BI-RADS CATEGORY  4: Suspicious.

## 2015-04-01 IMAGING — US US BIOPSY BREAST CORE W/ IMAGING
1 series · 13 of 16 positions shown · non-contrast
Comparison: Previous exams.

ADDENDUM:
PATHOLOGY ADDENDUM:

Pathology: Invasive mammary carcinoma, no special type, with
necrosis
Pathology concordance with imaging findings: Yes
Recommendation: Surgical consultation.
At the request of the patient, I spoke with her by telephone on
09/15/2013 at [DATE]. She reports bruising at the biopsy site but is
otherwise doing fine.
The pathology report was called to Dr. [REDACTED] by Dr. Raquel on
09/14/2013.
CLINICAL DATA: Suspicious mass in the left breast at 6 o'clock.
EXAM:
ULTRASOUND GUIDED LEFT BREAST CORE NEEDLE BIOPSY

[Series 1: us biopsy breast core w/ imaging · 0.08mm/px · 13 of 16 slices shown]
[im 1/16]
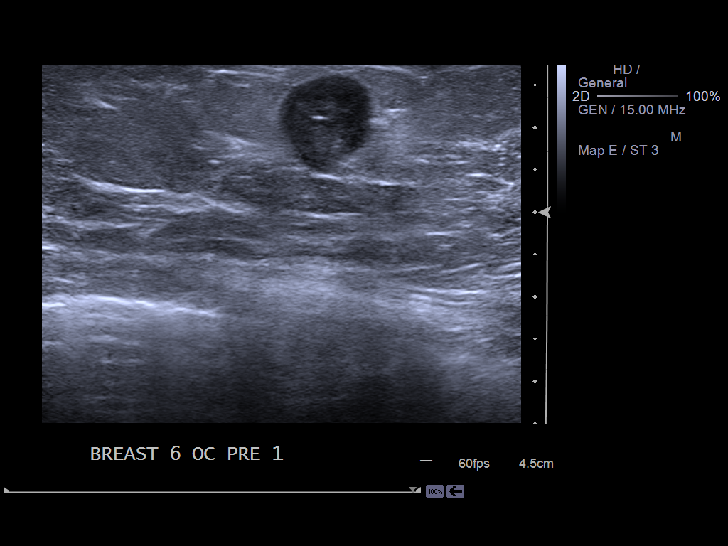
[im 2/16]
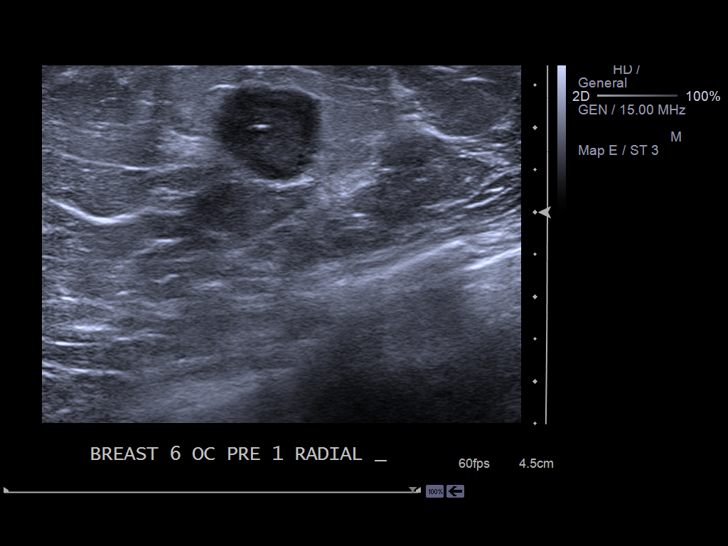
[im 4/16]
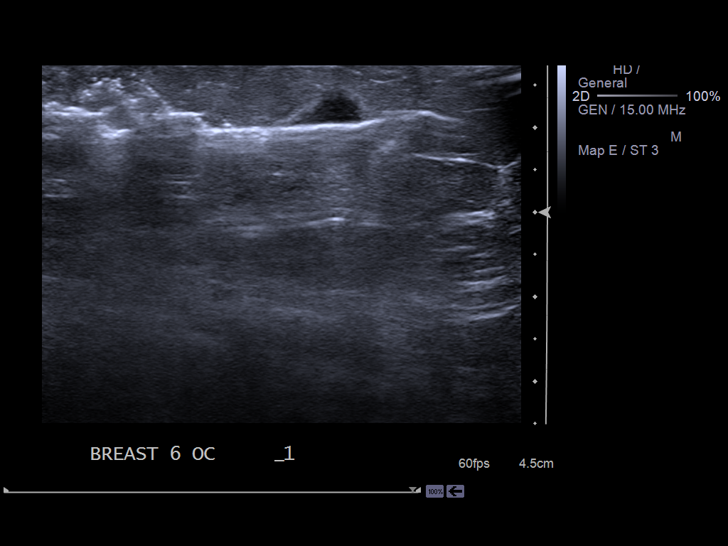
[im 5/16]
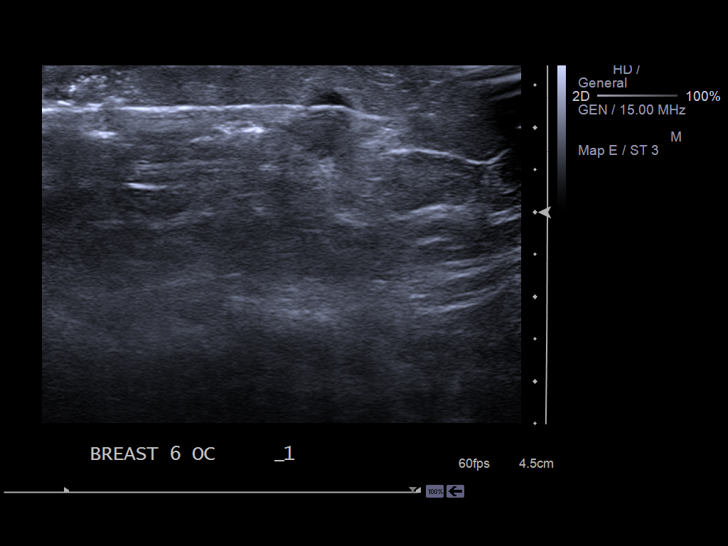
[im 6/16]
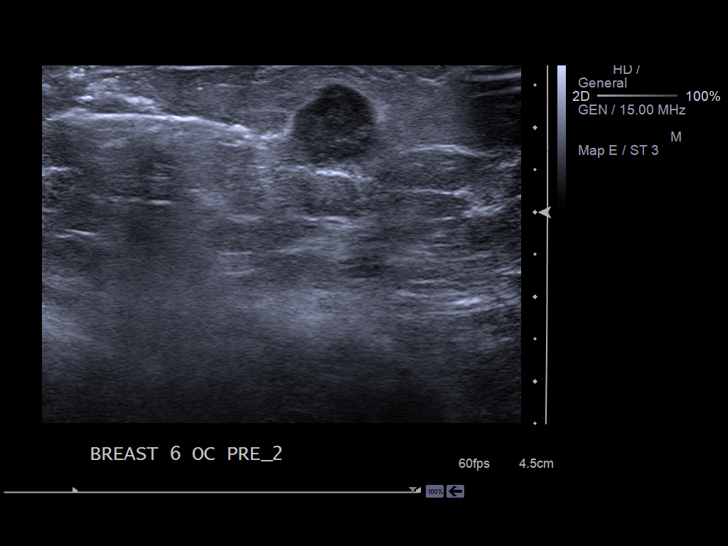
[im 7/16]
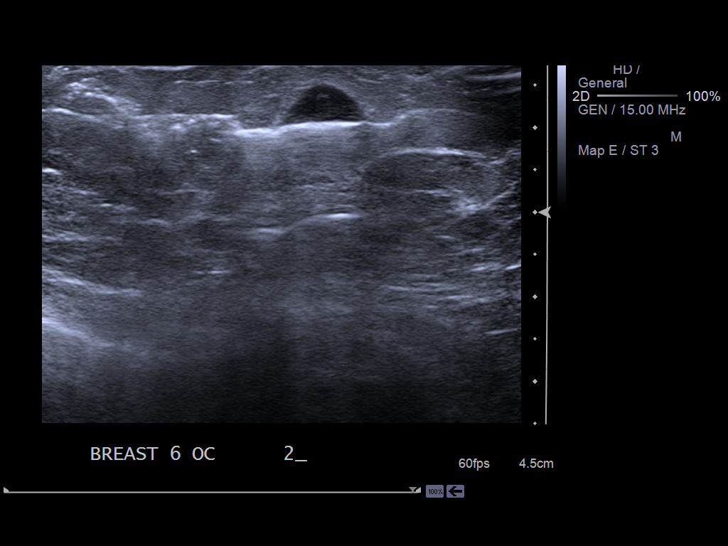
[im 9/16]
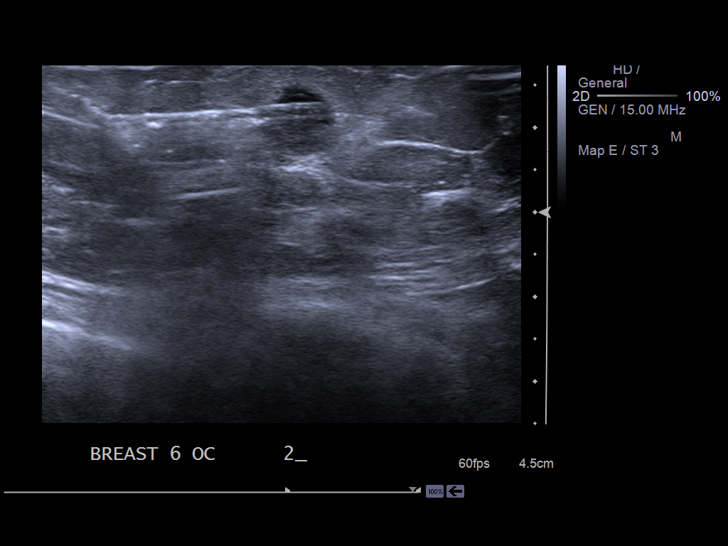
[im 10/16]
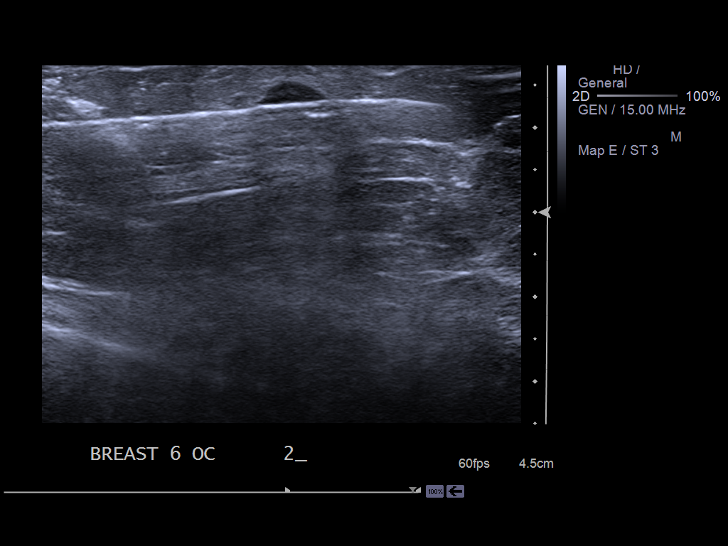
[im 11/16]
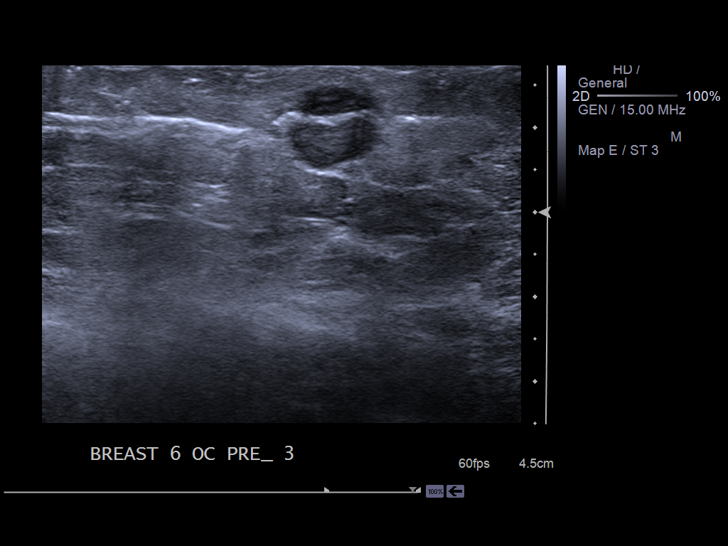
[im 12/16]
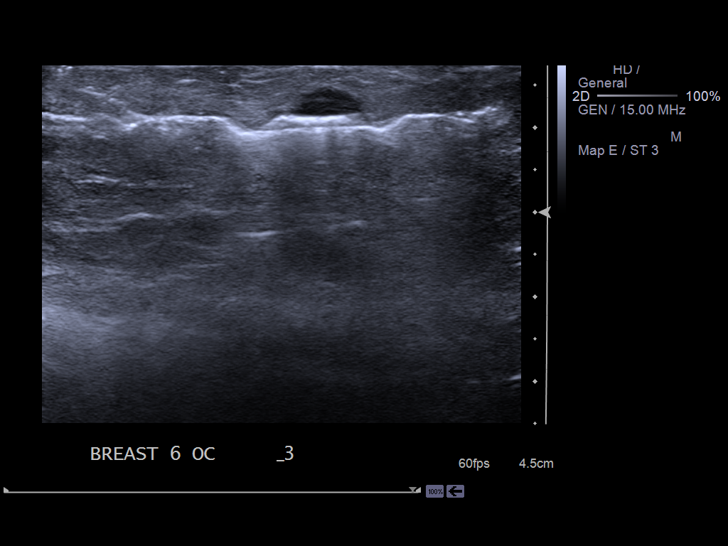
[im 13/16]
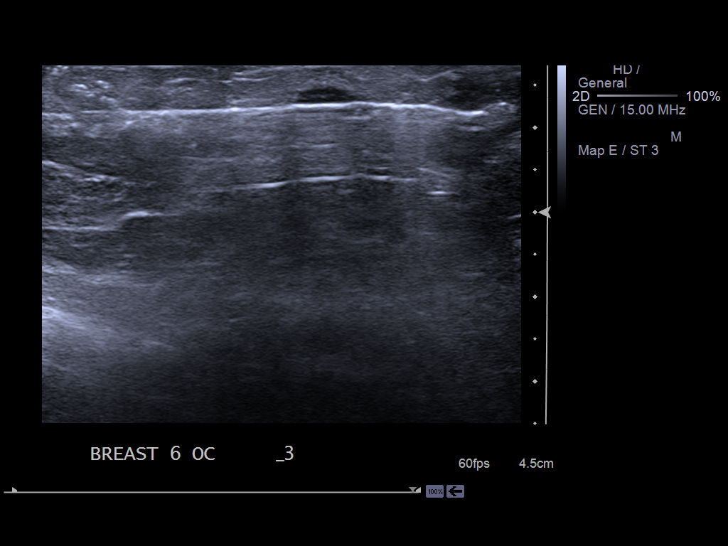
[im 15/16]
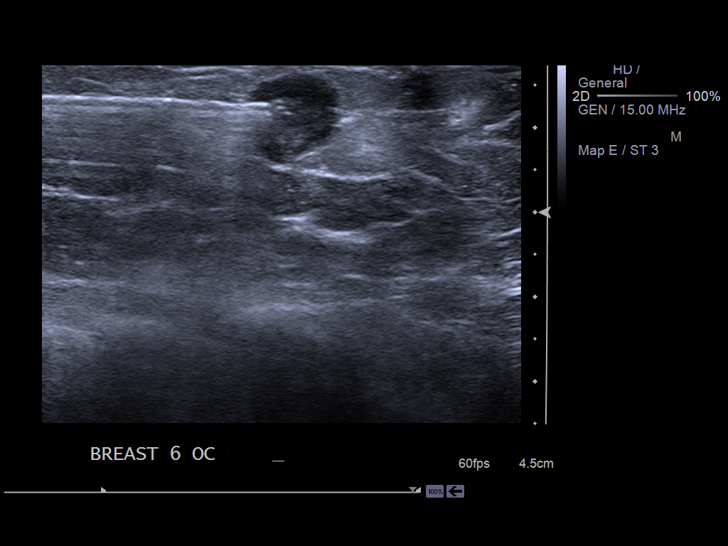
[im 16/16]
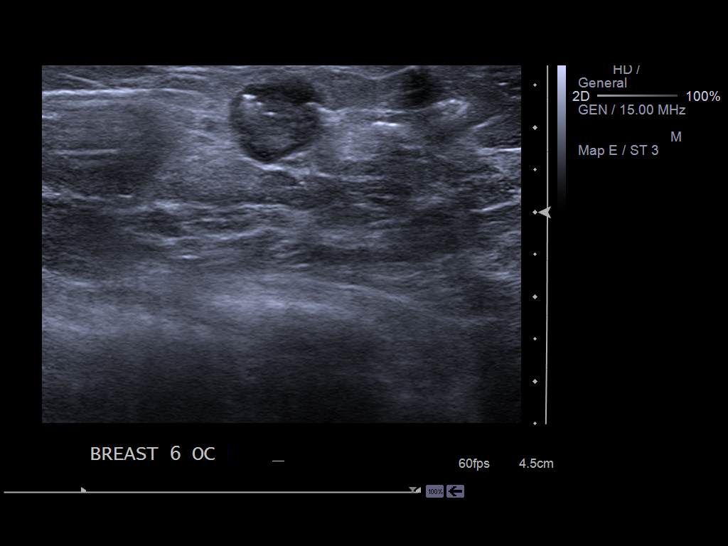

[13 of 16 positions shown; findings below may reference images not displayed]

PROCEDURE:
I met with the patient and we discussed the procedure of
ultrasound-guided biopsy, including benefits and alternatives. We
discussed the high likelihood of a successful procedure. We
discussed the risks of the procedure including infection, bleeding,
tissue injury, clip migration, and inadequate sampling. Informed
written consent was given. The usual time-out protocol was performed
immediately prior to the procedure.

Using sterile technique and 2% Lidocaine as local anesthetic, under
direct ultrasound visualization, a 12 gauge vacuum-assisteddevice
was used to perform biopsy of the mass in the left breast at 6
o'clock using a medial approach. At the conclusion of the procedure,
a coil shaped tissue marker clip was deployed into the biopsy
cavity. Follow-up 2-view mammogram was performed and dictated
separately.
IMPRESSION: Ultrasound-guided biopsy of the suspicious mass in the left breast
at 6 o'clock. No apparent complications.

## 2015-11-12 DIAGNOSIS — M62838 Other muscle spasm: Secondary | ICD-10-CM | POA: Insufficient documentation

## 2016-03-24 DIAGNOSIS — M1711 Unilateral primary osteoarthritis, right knee: Secondary | ICD-10-CM | POA: Insufficient documentation

## 2016-08-10 DIAGNOSIS — Z9011 Acquired absence of right breast and nipple: Secondary | ICD-10-CM | POA: Insufficient documentation

## 2016-08-14 DIAGNOSIS — N644 Mastodynia: Secondary | ICD-10-CM | POA: Insufficient documentation

## 2016-08-31 ENCOUNTER — Emergency Department: Payer: No Typology Code available for payment source

## 2016-08-31 ENCOUNTER — Encounter: Payer: Self-pay | Admitting: Emergency Medicine

## 2016-08-31 ENCOUNTER — Emergency Department
Admission: EM | Admit: 2016-08-31 | Discharge: 2016-08-31 | Disposition: A | Discharge status: Home / Self Care | Payer: No Typology Code available for payment source | Attending: Emergency Medicine | Admitting: Emergency Medicine

## 2016-08-31 DIAGNOSIS — S42025A Nondisplaced fracture of shaft of left clavicle, initial encounter for closed fracture: Secondary | ICD-10-CM

## 2016-08-31 DIAGNOSIS — Y999 Unspecified external cause status: Secondary | ICD-10-CM | POA: Diagnosis not present

## 2016-08-31 DIAGNOSIS — Y929 Unspecified place or not applicable: Secondary | ICD-10-CM | POA: Diagnosis not present

## 2016-08-31 DIAGNOSIS — Y939 Activity, unspecified: Secondary | ICD-10-CM | POA: Insufficient documentation

## 2016-08-31 DIAGNOSIS — S0083XA Contusion of other part of head, initial encounter: Secondary | ICD-10-CM | POA: Diagnosis not present

## 2016-08-31 DIAGNOSIS — S0990XA Unspecified injury of head, initial encounter: Secondary | ICD-10-CM | POA: Insufficient documentation

## 2016-08-31 DIAGNOSIS — S4992XA Unspecified injury of left shoulder and upper arm, initial encounter: Secondary | ICD-10-CM | POA: Diagnosis present

## 2016-08-31 DIAGNOSIS — S2232XA Fracture of one rib, left side, initial encounter for closed fracture: Secondary | ICD-10-CM | POA: Diagnosis not present

## 2016-08-31 HISTORY — DX: Malignant (primary) neoplasm, unspecified: C80.1

## 2016-08-31 MED ORDER — CYCLOBENZAPRINE HCL 5 MG PO TABS: 5.0000 mg | ORAL_TABLET | Freq: Three times a day (TID) | ORAL | 0 refills | Status: DC | PRN

## 2016-08-31 MED ORDER — METOCLOPRAMIDE HCL 5 MG/ML IJ SOLN
10.0000 mg | Freq: Once | INTRAMUSCULAR | Status: AC
  Administered 2016-08-31: 10 mg via INTRAVENOUS
  Filled 2016-08-31: qty 2

## 2016-08-31 MED ORDER — ORPHENADRINE CITRATE 30 MG/ML IJ SOLN
60.0000 mg | INTRAMUSCULAR | Status: AC
  Administered 2016-08-31: 60 mg via INTRAVENOUS
  Filled 2016-08-31: qty 2

## 2016-08-31 MED ORDER — HYDROMORPHONE HCL 1 MG/ML PO LIQD: 1.0000 mg | Freq: Once | ORAL | Status: DC

## 2016-08-31 MED ORDER — KETOROLAC TROMETHAMINE 30 MG/ML IJ SOLN
30.0000 mg | Freq: Once | INTRAMUSCULAR | Status: AC
  Administered 2016-08-31: 30 mg via INTRAVENOUS
  Filled 2016-08-31: qty 1

## 2016-08-31 MED ORDER — OXYCODONE-ACETAMINOPHEN 7.5-325 MG PO TABS: 1.0000 | ORAL_TABLET | Freq: Three times a day (TID) | ORAL | 0 refills | Status: AC | PRN

## 2016-08-31 MED ORDER — HYDROMORPHONE HCL 1 MG/ML IJ SOLN
1.0000 mg | Freq: Once | INTRAMUSCULAR | Status: AC
  Administered 2016-08-31: 1 mg via INTRAVENOUS
  Filled 2016-08-31: qty 1

## 2016-08-31 MED ORDER — MORPHINE SULFATE (PF) 2 MG/ML IV SOLN
2.0000 mg | Freq: Once | INTRAVENOUS | Status: AC
  Administered 2016-08-31: 2 mg via INTRAVENOUS
  Filled 2016-08-31: qty 1

## 2016-08-31 MED ORDER — KETOROLAC TROMETHAMINE 10 MG PO TABS: 10.0000 mg | ORAL_TABLET | Freq: Three times a day (TID) | ORAL | 0 refills | Status: DC

## 2016-08-31 NOTE — ED Notes (Signed)
Back from x-ray  Requesting more pain meds  Provider aware

## 2016-08-31 NOTE — ED Notes (Addendum)
Sling applied to left arm. Family at bedside. PA at bedside.
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
## 2016-08-31 NOTE — Discharge Instructions (Signed)
Your exam, x-rays, and CT scans show a clavicle (collar bone) and rib fracture. Wear the arm sling for comfort. Take the prescription meds as directed. Apply ice to the shoulder and ribs as needed. Follow-up with your provider or ortho for further management.

## 2016-12-09 ENCOUNTER — Telehealth: Payer: Self-pay | Admitting: *Deleted

## 2016-12-09 NOTE — Telephone Encounter (Signed)
Pt CD from [facility] on [address]

## 2016-12-15 ENCOUNTER — Other Ambulatory Visit: Payer: Self-pay | Admitting: Neurology

## 2016-12-17 ENCOUNTER — Other Ambulatory Visit: Payer: Self-pay | Admitting: Neurology

## 2016-12-17 DIAGNOSIS — M792 Neuralgia and neuritis, unspecified: Secondary | ICD-10-CM

## 2016-12-17 DIAGNOSIS — R200 Anesthesia of skin: Secondary | ICD-10-CM

## 2016-12-17 DIAGNOSIS — R202 Paresthesia of skin: Principal | ICD-10-CM

## 2017-06-07 DIAGNOSIS — K3189 Other diseases of stomach and duodenum: Secondary | ICD-10-CM | POA: Insufficient documentation

## 2017-11-08 ENCOUNTER — Other Ambulatory Visit: Payer: Self-pay

## 2017-11-08 ENCOUNTER — Ambulatory Visit
Admission: EM | Admit: 2017-11-08 | Discharge: 2017-11-08 | Disposition: A | Discharge status: Home / Self Care | Payer: BLUE CROSS/BLUE SHIELD | Attending: Family Medicine | Admitting: Family Medicine

## 2017-11-08 DIAGNOSIS — J0100 Acute maxillary sinusitis, unspecified: Secondary | ICD-10-CM

## 2017-11-08 DIAGNOSIS — R059 Cough, unspecified: Secondary | ICD-10-CM

## 2017-11-08 DIAGNOSIS — R05 Cough: Secondary | ICD-10-CM

## 2017-11-08 LAB — RAPID STREP SCREEN (MED CTR MEBANE ONLY): STREPTOCOCCUS, GROUP A SCREEN (DIRECT): NEGATIVE

## 2017-11-08 MED ORDER — HYDROCOD POLST-CPM POLST ER 10-8 MG/5ML PO SUER: 5.0000 mL | Freq: Every evening | ORAL | 0 refills | Status: DC | PRN

## 2017-11-08 MED ORDER — BENZONATATE 100 MG PO CAPS: 100.0000 mg | ORAL_CAPSULE | Freq: Three times a day (TID) | ORAL | 0 refills | Status: DC | PRN

## 2017-11-08 NOTE — ED Triage Notes (Signed)
Patient complains of sore throat x 1 week. States that she was seen by primary md on Thursday and was given prednisone and was told it was viral. Patient states that she called in on Sunday and was given Augmentin. Patient states that today she feels worse today and feels like her infection has settled in her chest now.

## 2017-11-08 NOTE — ED Provider Notes (Signed)
[facility] ____________________________________________  Time seen: Approximately 10:00AM  I have reviewed the triage vital signs and the nursing notes.   HISTORY  Chief Complaint Sore Throat   HPI Belinda Norman is a 57 y.o. female presenting for evaluation of 7 days of not feeling well.  Reports started off initially with a mild sore throat that then progressed to increased sore throat with loss of voice.  States that she was seen by her primary on Thursday and was told it was a viral, then lost her voice on Friday and was called in prednisone.  Reports on Saturday she felt worse with accompanying sinus pressure, nasal drainage and cough.  States that she was getting a lot of thick nasal drainage out that was yellow.  States that she called the on-call on Saturday and then on Sunday she was sent in Augmentin.  States that she has taken 2 doses of Augmentin last night and one this morning.  Presenting today as she feels like she is coughing and not feeling better.  Some hot and cold sensation.  Continues to drink plenty of fluids, decreased appetite.  States a lot of sinus pressure around her cheeks and forehead with continued nasal drainage.  States cough is worse at night and earlier in the morning but does still continue throughout the day.  No known wheezing.  States with the cough she sometimes feels tightness in her chest.  Denies chest pain or shortness of breath.  No known direct sick contacts, but reports just prior to sick onset she was at her daughter's wedding.  Denies recent sickness or recent medical changes.  Has been taken some over-the-counter nasal saline flushes, Afrin, completed the prednisone and now taken Augmentin.  Denies other aggravating alleviating factors.  Reports otherwise doing well.  Einar Pheasant, MD: PCP   Past Medical History:  Diagnosis Date  . Cancer ([place])   . Hepatitis C   . HSV (herpes simplex virus) infection   . IBS (irritable  bowel syndrome)     Patient Active Problem List   Diagnosis Date Noted  . Back pain 12/18/2012  . Hemorrhoids 12/18/2012  . Thrombocytopenia ([place]) 12/18/2012  . GERD (gastroesophageal reflux disease) 12/05/2011  . Depression 12/05/2011  . Hepatitis C 11/18/2011    Past Surgical History:  Procedure Laterality Date  . arthroscopic knee surgery     x2  . CESAREAN SECTION    . CHOLECYSTECTOMY  10/1998  . FOOT SURGERY    . knee surgery with nerve repair    . TONSILLECTOMY    . TOTAL ABDOMINAL HYSTERECTOMY W/ BILATERAL SALPINGOOPHORECTOMY  1999     No current facility-administered medications for this encounter.   Current Outpatient Medications:  .  amoxicillin-clavulanate (AUGMENTIN) 875-125 MG tablet, , Disp: , Rfl:  .  Cholecalciferol (VITAMIN D3) 1000 units CAPS, Take by mouth., Disp: , Rfl:  .  clonazePAM (KLONOPIN) 1 MG tablet, clonazepam 1 mg tablet  TAKE 1 TABLET BY MOUTH TWICE A DAY AS NEEDED, Disp: , Rfl:  .  hyoscyamine (ANASPAZ) 0.125 MG TBDP disintergrating tablet, hyoscyamine 0.125 mg disintegrating tablet, Disp: , Rfl:  .  sertraline (ZOLOFT) 100 MG tablet, Take 100 mg by mouth daily., Disp: , Rfl:  .  benzonatate (TESSALON PERLES) 100 MG capsule, Take 1 capsule (100 mg total) by mouth 3 (three) times daily as needed for cough., Disp: 15 capsule, Rfl: 0 .  chlorpheniramine-HYDROcodone (TUSSIONEX PENNKINETIC ER) 10-8 MG/5ML SUER, Take 5 mLs by mouth at bedtime  as needed for cough. do not drive or operate machinery while taking as can cause drowsiness., Disp: 50 mL, Rfl: 0  Allergies Zofran [ondansetron hcl] and Codeine  Family History  Problem Relation Age of Onset  . Hypertension Father   . Liver cancer Mother        bile duct cancer  . Alcohol abuse Mother   . Diabetes Brother   . Breast cancer Paternal Aunt   . Throat cancer Paternal Grandmother   . Prostate cancer Paternal Grandfather   . Colon cancer Neg Hx     Social History Social History    Tobacco Use  . Smoking status: Never Smoker  . Smokeless tobacco: Never Used  Substance Use Topics  . Alcohol use: Yes    Comment: rare  . Drug use: No    Review of Systems Constitutional: Some warm and chills sensation. ENT: States sore throat has since resolved. Cardiovascular: Denies chest pain. Respiratory: Denies shortness of breath. Gastrointestinal: No abdominal pain.  No nausea, no vomiting.  No diarrhea.  Genitourinary: Negative for dysuria. Musculoskeletal: Negative for back pain. Skin: Negative for rash.   ____________________________________________   PHYSICAL EXAM:  VITAL SIGNS: ED Triage Vitals  Enc Vitals Group     BP 11/08/17 0925 122/83     Pulse Rate 11/08/17 0925 81     Resp 11/08/17 0925 18     Temp 11/08/17 0925 98.4 F (36.9 C)     Temp Source 11/08/17 0925 Oral     SpO2 11/08/17 0925 99 %     Weight 11/08/17 0921 153 lb (69.4 kg)     Height 11/08/17 0921 5\' 7"  (1.702 m)     Head Circumference --      Peak Flow --      Pain Score 11/08/17 0920 7     Pain Loc --      Pain Edu? --      Excl. in [place]? --    Constitutional: Alert and oriented. Well appearing and in no acute distress. Eyes: Conjunctivae are normal.  Head: Atraumatic.Mild to moderate tenderness to palpation bilateral frontal and maxillary sinuses, maxillary worse than frontal.. No swelling. No erythema.   Ears: no erythema, normal TMs bilaterally.   Nose: nasal congestion with bilateral nasal turbinate erythema and edema.   Mouth/Throat: Mucous membranes are moist.  Oropharynx non-erythematous.No tonsillar swelling or exudate.  Neck: No stridor.  No cervical spine tenderness to palpation. Hematological/Lymphatic/Immunilogical: No cervical lymphadenopathy. Cardiovascular: Normal rate, regular rhythm. Grossly normal heart sounds.  Good peripheral circulation. Respiratory: Normal respiratory effort.  No retractions. No wheezes, rales or rhonchi. Good air movement.  Dry intermittent  cough noted in room. Musculoskeletal: Steady gait.  No lower extremity edema noted. Neurologic:  Normal speech and language. No gross focal neurologic deficits are appreciated. No gait instability. Skin:  Skin is warm, dry and intact. No rash noted. Psychiatric: Mood and affect are normal. Speech and behavior are normal.  ___________________________________________   LABS (all labs ordered are listed, but only abnormal results are displayed)  Labs Reviewed  RAPID STREP SCREEN ([facility] ONLY)  CULTURE, GROUP A STREP [facility])   ____________________________________________   PROCEDURES Procedures  ________________________________________   INITIAL IMPRESSION / ASSESSMENT AND PLAN / ED COURSE  Pertinent labs & imaging results that were available during my care of the patient were reviewed by me and considered in my medical decision making (see chart for details).  Overall well-appearing patient.  No acute distress.  Quick strep  negative, will culture.  Suspect viral upper respiratory infection, with possible secondary sinusitis.  Continue over-the-counter nasal saline rinse, continue current Augmentin.  Over-the-counter Mucinex as needed.  Rest, fluids, Rx PRN Tessalon Perles and Tussionex.  Discussed follow-up and return parameters.Discussed indication, risks and benefits of medications with patient.  Discussed follow up with Primary care physician this week. Discussed follow up and return parameters including no resolution or any worsening concerns. Patient verbalized understanding and agreed to plan.   ____________________________________________   FINAL CLINICAL IMPRESSION(S) / ED DIAGNOSES  Final diagnoses:  Acute maxillary sinusitis, recurrence not specified  Cough     ED Discharge Orders         Ordered    chlorpheniramine-HYDROcodone (TUSSIONEX PENNKINETIC ER) 10-8 MG/5ML SUER  At bedtime PRN     11/08/17 0945    benzonatate (TESSALON PERLES) 100 MG capsule  3  times daily PRN     11/08/17 0945           Note: This dictation was prepared with Dragon dictation along with smaller phrase technology. Any transcriptional errors that result from this process are unintentional.         Marylene Land, NP 11/08/17 1154

## 2017-11-08 NOTE — Discharge Instructions (Addendum)
Take medication as prescribed. Rest. Drink plenty of fluids.  Continue Augmentin, saline nasal spray and take over-the-counter Mucinex.  Follow up with your primary care physician this week as needed. Return to Urgent care for new or worsening concerns.

## 2017-11-10 ENCOUNTER — Ambulatory Visit
Admission: EM | Admit: 2017-11-10 | Discharge: 2017-11-10 | Disposition: A | Discharge status: Home / Self Care | Payer: BLUE CROSS/BLUE SHIELD | Attending: Family Medicine | Admitting: Family Medicine

## 2017-11-10 ENCOUNTER — Ambulatory Visit (INDEPENDENT_AMBULATORY_CARE_PROVIDER_SITE_OTHER): Payer: BLUE CROSS/BLUE SHIELD

## 2017-11-10 ENCOUNTER — Other Ambulatory Visit: Payer: Self-pay

## 2017-11-10 DIAGNOSIS — J0100 Acute maxillary sinusitis, unspecified: Secondary | ICD-10-CM

## 2017-11-10 LAB — CULTURE, GROUP A STREP (THRC)

## 2017-11-10 MED ORDER — PREDNISONE 20 MG PO TABS: 20.0000 mg | ORAL_TABLET | Freq: Every day | ORAL | 0 refills | Status: DC

## 2017-11-10 NOTE — ED Triage Notes (Signed)
Patient complains of cough, congestion, sinus pain and pressure. Patient states that her symptoms have worsened since she seen [place] on Monday. Patient states that she is currently on augmentin, tussionex, tessalon, prednisone and is not improving.

## 2017-11-10 NOTE — Discharge Instructions (Signed)
Sudafed 12 hr take 1 tab twice day  Afrin for 3 more days use 15 minutes before Flonase

## 2017-11-10 NOTE — ED Provider Notes (Signed)
[facility]    CSN: [id number] Arrival date & time: 11/10/17  3557     History   Chief Complaint Chief Complaint  Patient presents with  . Cough    HPI Deshanta Lady is a 57 y.o. female.   The history is provided by the patient.  Cough  URI  Presenting symptoms: congestion, cough and facial pain   Severity:  Moderate Onset quality:  Sudden Duration:  6 days Timing:  Constant Progression:  Unchanged Chronicity:  New Relieved by:  Nothing Ineffective treatments:  Prescription medications (has been taking augmentin for 4 days) Associated symptoms: sinus pain   Risk factors: sick contacts   Risk factors: not elderly, no chronic cardiac disease, no chronic kidney disease, no chronic respiratory disease, no diabetes mellitus, no immunosuppression, no recent illness and no recent travel     Past Medical History:  Diagnosis Date  . Cancer ([place])   . Hepatitis C   . HSV (herpes simplex virus) infection   . IBS (irritable bowel syndrome)     Patient Active Problem List   Diagnosis Date Noted  . Back pain 12/18/2012  . Hemorrhoids 12/18/2012  . Thrombocytopenia ([place]) 12/18/2012  . GERD (gastroesophageal reflux disease) 12/05/2011  . Depression 12/05/2011  . Hepatitis C 11/18/2011    Past Surgical History:  Procedure Laterality Date  . arthroscopic knee surgery     x2  . CESAREAN SECTION    . CHOLECYSTECTOMY  10/1998  . FOOT SURGERY    . knee surgery with nerve repair    . TONSILLECTOMY    . TOTAL ABDOMINAL HYSTERECTOMY W/ BILATERAL SALPINGOOPHORECTOMY  1999    OB History   None      Home Medications    Prior to Admission medications   Medication Sig Start Date End Date Taking? Authorizing Provider  amoxicillin-clavulanate (AUGMENTIN) 875-125 MG tablet  11/06/17  Yes [provider]  benzonatate (TESSALON PERLES) 100 MG capsule Take 1 capsule (100 mg total) by mouth 3 (three) times daily as needed for cough. 11/08/17  Yes  Marylene Land, NP  chlorpheniramine-HYDROcodone [facility] ER) 10-8 MG/5ML SUER Take 5 mLs by mouth at bedtime as needed for cough. do not drive or operate machinery while taking as can cause drowsiness. 11/08/17  Yes Marylene Land, NP  Cholecalciferol (VITAMIN D3) 1000 units CAPS Take by mouth.   Yes [provider]  clonazePAM (KLONOPIN) 1 MG tablet clonazepam 1 mg tablet  TAKE 1 TABLET BY MOUTH TWICE A DAY AS NEEDED 09/13/14  Yes [provider]  hyoscyamine (ANASPAZ) 0.125 MG TBDP disintergrating tablet hyoscyamine 0.125 mg disintegrating tablet 08/23/15  Yes [provider]  sertraline (ZOLOFT) 100 MG tablet Take 100 mg by mouth daily.   Yes [provider]  predniSONE (DELTASONE) 20 MG tablet Take 1 tablet (20 mg total) by mouth daily. 11/10/17   Norval Gable, MD    Family History Family History  Problem Relation Age of Onset  . Hypertension Father   . Liver cancer Mother        bile duct cancer  . Alcohol abuse Mother   . Diabetes Brother   . Breast cancer Paternal Aunt   . Throat cancer Paternal Grandmother   . Prostate cancer Paternal Grandfather   . Colon cancer Neg Hx     Social History Social History   Tobacco Use  . Smoking status: Never Smoker  . Smokeless tobacco: Never Used  Substance Use Topics  . Alcohol use: Yes  Comment: rare  . Drug use: No     Allergies   Zofran [ondansetron hcl] and Codeine   Review of Systems Review of Systems  HENT: Positive for congestion and sinus pain.   Respiratory: Positive for cough.      Physical Exam Triage Vital Signs ED Triage Vitals  Enc Vitals Group     BP 11/10/17 0904 118/81     Pulse Rate 11/10/17 0904 76     Resp 11/10/17 0904 18     Temp 11/10/17 0904 98.7 F (37.1 C)     Temp Source 11/10/17 0904 Oral     SpO2 11/10/17 0904 100 %     Weight 11/10/17 0902 153 lb (69.4 kg)     Height 11/10/17 0902 5\' 7"  (1.702 m)     Head Circumference --       Peak Flow --      Pain Score 11/10/17 0902 7     Pain Loc --      Pain Edu? --      Excl. in [place]? --    No data found.  Updated Vital Signs BP 118/81 (BP Location: Right Arm)   Pulse 76   Temp 98.7 F (37.1 C) (Oral)   Resp 18   Ht 5\' 7"  (1.702 m)   Wt 69.4 kg   SpO2 100%   BMI 23.96 kg/m   Visual Acuity Right Eye Distance:   Left Eye Distance:   Bilateral Distance:    Right Eye Near:   Left Eye Near:    Bilateral Near:     Physical Exam  Constitutional: She appears well-developed and well-nourished. No distress.  HENT:  Head: Normocephalic and atraumatic.  Right Ear: Tympanic membrane, external ear and ear canal normal.  Left Ear: Tympanic membrane, external ear and ear canal normal.  Nose: Mucosal edema and rhinorrhea present. No nose lacerations, sinus tenderness, nasal deformity, septal deviation or nasal septal hematoma. No epistaxis.  No foreign bodies. Right sinus exhibits maxillary sinus tenderness and frontal sinus tenderness. Left sinus exhibits maxillary sinus tenderness and frontal sinus tenderness.  Mouth/Throat: Uvula is midline, oropharynx is clear and moist and mucous membranes are normal. No oropharyngeal exudate, posterior oropharyngeal erythema or tonsillar abscesses. No tonsillar exudate.  Eyes: Conjunctivae are normal. Right eye exhibits no discharge. Left eye exhibits no discharge. No scleral icterus.  Neck: Normal range of motion. Neck supple. No thyromegaly present.  Cardiovascular: Normal rate, regular rhythm and normal heart sounds.  Pulmonary/Chest: Effort normal and breath sounds normal. No respiratory distress. She has no wheezes. She has no rales.  Lymphadenopathy:    She has no cervical adenopathy.  Skin: She is not diaphoretic.  Nursing note and vitals reviewed.    UC Treatments / Results  Labs (all labs ordered are listed, but only abnormal results are displayed) Labs Reviewed - No data to display  EKG None  Radiology Dg Chest 2  View  Result Date: 11/10/2017 CLINICAL DATA:  Pt states she has been sick x 1 week with sore throat, prod cough, chest and sinus drainage, nasal congestion, fatigue. Sore neck glands. Hx rib fractures on left and left clavicle 2018. Double mastectomy 4 years ago with reconstruction EXAM: CHEST - 2 VIEW COMPARISON:  08/29/2008 FINDINGS: The lungs appear clear.  Cardiac and mediastinal contours normal. No pleural effusion identified. Old healed left mid clavicular deformity. Bilateral breast implants noted. IMPRESSION: 1.  No active cardiopulmonary disease is radiographically apparent. 2. Old left mid clavicular fracture, healed.  Electronically Signed   By: Van Clines M.D.   On: 11/10/2017 10:10    Procedures Procedures (including critical care time)  Medications Ordered in UC Medications - No data to display  Initial Impression / Assessment and Plan / UC Course  I have reviewed the triage vital signs and the nursing notes.  Pertinent labs & imaging results that were available during my care of the patient were reviewed by me and considered in my medical decision making (see chart for details).      Final Clinical Impressions(s) / UC Diagnoses   Final diagnoses:  Acute maxillary sinusitis, recurrence not specified     Discharge Instructions     Sudafed 12 hr take 1 tab twice day  Afrin for 3 more days use 15 minutes before Flonase    ED Prescriptions    Medication Sig Dispense Auth. Provider   predniSONE (DELTASONE) 20 MG tablet Take 1 tablet (20 mg total) by mouth daily. 5 tablet Norval Gable, MD     1. x-ray results and diagnosis reviewed with patient 2. rx as per orders above; reviewed possible side effects, interactions, risks and benefits  3. Continue current augmentin course 4.  Recommend supportive treatment as above  5. Follow-up prn if symptoms worsen or don't improve   Controlled Substance Prescriptions [place] Controlled Substance Registry consulted? Not  Applicable   Norval Gable, MD 11/10/17 1054

## 2018-03-27 NOTE — Discharge Instructions (Signed)
°  Instructions after Total Knee Replacement ° ° Belinda Norman, Jr., M.D.    ° Dept. of Orthopaedics & Sports Medicine ° [facility] ° [address] ° [place], [place]  [address] ° Phone: [phone]   Fax: [phone] ° °  °DIET: °• Drink plenty of non-alcoholic fluids. °• Resume your normal diet. Include foods high in fiber. ° °ACTIVITY:  °• You may use crutches or a walker with weight-bearing as tolerated, unless instructed otherwise. °• You may be weaned off of the walker or crutches by your Physical Therapist.  °• Do NOT place pillows under the knee. Anything placed under the knee could limit your ability to straighten the knee.   °• Continue doing gentle exercises. Exercising will reduce the pain and swelling, increase motion, and prevent muscle weakness.   °• Please continue to use the TED compression stockings for 6 weeks. You may remove the stockings at night, but should reapply them in the morning. °• Do not drive or operate any equipment until instructed. ° °WOUND CARE:  °• Continue to use the PolarCare or ice packs periodically to reduce pain and swelling. °• You may bathe or shower after the staples are removed at the first office visit following surgery. ° °MEDICATIONS: °• You may resume your regular medications. °• Please take the pain medication as prescribed on the medication. °• Do not take pain medication on an empty stomach. °• You have been given a prescription for a blood thinner (Lovenox or Coumadin). Please take the medication as instructed. (NOTE: After completing a 2 week course of Lovenox, take one Enteric-coated aspirin once a day. This along with elevation will help reduce the possibility of phlebitis in your operated leg.) °• Do not drive or drink alcoholic beverages when taking pain medications. ° °CALL THE OFFICE FOR: °• Temperature above 101 degrees °• Excessive bleeding or drainage on the dressing. °• Excessive swelling, coldness, or paleness of the toes. °• Persistent  nausea and vomiting. ° °FOLLOW-UP:  °• You should have an appointment to return to the office in 10-14 days after surgery. °• Arrangements have been made for continuation of Physical Therapy (either home therapy or outpatient therapy). °  °

## 2018-03-30 ENCOUNTER — Other Ambulatory Visit: Payer: Self-pay

## 2018-03-30 ENCOUNTER — Encounter
Admission: RE | Admit: 2018-03-30 | Discharge: 2018-03-30 | Disposition: A | Discharge status: Home / Self Care | Payer: BLUE CROSS/BLUE SHIELD | Source: Ambulatory Visit | Attending: Orthopedic Surgery | Admitting: Orthopedic Surgery

## 2018-03-30 DIAGNOSIS — Z01812 Encounter for preprocedural laboratory examination: Secondary | ICD-10-CM | POA: Diagnosis present

## 2018-03-30 HISTORY — DX: Gastro-esophageal reflux disease without esophagitis: K21.9

## 2018-03-30 HISTORY — DX: Anxiety disorder, unspecified: F41.9

## 2018-03-30 HISTORY — DX: Unspecified osteoarthritis, unspecified site: M19.90

## 2018-03-30 LAB — COMPREHENSIVE METABOLIC PANEL
ALT: 20 U/L (ref 0–44)
AST: 17 U/L (ref 15–41)
Albumin: 4.4 g/dL (ref 3.5–5.0)
Alkaline Phosphatase: 56 U/L (ref 38–126)
Anion gap: 8 (ref 5–15)
BUN: 15 mg/dL (ref 6–20)
CHLORIDE: 103 mmol/L (ref 98–111)
CO2: 27 mmol/L (ref 22–32)
CREATININE: 0.68 mg/dL (ref 0.44–1.00)
Calcium: 9.2 mg/dL (ref 8.9–10.3)
GFR calc Af Amer: >60 mL/min (ref >60)
GFR calc non Af Amer: >60 mL/min (ref >60)
Glucose, Bld: 100 mg/dL — ABNORMAL HIGH (ref 70–99)
POTASSIUM: 3.9 mmol/L (ref 3.5–5.1)
SODIUM: 138 mmol/L (ref 135–145)
Total Bilirubin: 0.5 mg/dL (ref 0.3–1.2)
Total Protein: 7.1 g/dL (ref 6.5–8.1)

## 2018-03-30 LAB — CBC
HCT: 43.8 % (ref 36.0–46.0)
Hemoglobin: 14.9 g/dL (ref 12.0–15.0)
MCH: 31.5 pg (ref 26.0–34.0)
MCHC: 34 g/dL (ref 30.0–36.0)
MCV: 92.6 fL (ref 80.0–100.0)
PLATELETS: 162 10*3/uL (ref 150–400)
RBC: 4.73 MIL/uL (ref 3.87–5.11)
RDW: 12 % (ref 11.5–15.5)
WBC: 4.6 10*3/uL (ref 4.0–10.5)
nRBC: 0 % (ref 0.0–0.2)

## 2018-03-30 LAB — URINALYSIS, ROUTINE W REFLEX MICROSCOPIC
BILIRUBIN URINE: NEGATIVE
Bacteria, UA: NONE SEEN
Glucose, UA: NEGATIVE mg/dL
Ketones, ur: 5 mg/dL — AB
Leukocytes,Ua: NEGATIVE
Nitrite: NEGATIVE
Protein, ur: NEGATIVE mg/dL
Specific Gravity, Urine: 1.021 (ref 1.005–1.030)
pH: 5 (ref 5.0–8.0)

## 2018-03-30 LAB — TYPE AND SCREEN
ABO/RH(D): O POS
Antibody Screen: NEGATIVE

## 2018-03-30 LAB — PROTIME-INR
INR: 1 (ref 0.8–1.2)
PROTHROMBIN TIME: 12.9 s (ref 11.4–15.2)

## 2018-03-30 LAB — SURGICAL PCR SCREEN
MRSA, PCR: NEGATIVE
Staphylococcus aureus: NEGATIVE

## 2018-03-30 LAB — SEDIMENTATION RATE: Sed Rate: 7 mm/hr (ref 0–30)

## 2018-03-30 LAB — APTT: aPTT: 32 seconds (ref 24–36)

## 2018-03-30 LAB — C-REACTIVE PROTEIN: CRP: <0.8 mg/dL (ref <1.0)

## 2018-03-30 NOTE — Patient Instructions (Signed)
Your procedure is scheduled on: to be rescheduled Report to Same Day Surgery 2nd floor medical mall [facility] Entrance-take elevator on left to 2nd floor.  Check in with surgery information desk.) To find out your arrival time please call [phone] between 1PM - 3PM on   Remember: Instructions that are not followed completely may result in serious medical risk, up to and including death, or upon the discretion of your surgeon and anesthesiologist your surgery may need to be rescheduled.    _x___ 1. Do not eat food after midnight the night before your procedure. You may drink clear liquids up to 2 hours before you are scheduled to arrive at the hospital for your procedure.  Do not drink clear liquids within 2 hours of your scheduled arrival to the hospital.  Clear liquids include  --Water or Apple juice without pulp  --Clear carbohydrate beverage such as ClearFast or Gatorade  --Black Coffee or Clear Tea (No milk, no creamers, do not add anything to                  the coffee or Tea Type 1 and type 2 diabetics should only drink water.   ____Ensure clear carbohydrate drink on the way to the hospital for bariatric patients  ____Ensure clear carbohydrate drink 3 hours before surgery for Dr Dwyane Luo patients if physician instructed.   No gum chewing or hard candies.     __x__ 2. No Alcohol for 24 hours before or after surgery.   __x__3. No Smoking or e-cigarettes for 24 prior to surgery.  Do not use any chewable tobacco products for at least 6 hour prior to surgery   ____  4. Bring all medications with you on the day of surgery if instructed.    __x__ 5. Notify your doctor if there is any change in your medical condition     (cold, fever, infections).    x___6. On the morning of surgery brush your teeth with toothpaste and water.  You may rinse your mouth with mouth wash if you wish.  Do not swallow any toothpaste or mouthwash.   Do not wear jewelry, make-up, hairpins, clips or  nail polish.  Do not wear lotions, powders, or perfumes. You may wear deodorant.  Do not shave 48 hours prior to surgery. Men may shave face and neck.  Do not bring valuables to the hospital.    [facility] is not responsible for any belongings or valuables.               Contacts, dentures or bridgework may not be worn into surgery.  Leave your suitcase in the car. After surgery it may be brought to your room.  For patients admitted to the hospital, discharge time is determined by your                       treatment team.  _  Patients discharged the day of surgery will not be allowed to drive home.  You will need someone to drive you home and stay with you the night of your procedure.    Please read over the following fact sheets that you were given:   [facility] Preparing for Surgery and or MRSA Information   _x___ Take anti-hypertensive listed below, cardiac, seizure, asthma,     anti-reflux and psychiatric medicines. These include:  1. DULoxetine (CYMBALTA) 30 MG capsule  2.fexofenadine (ALLEGRA) 180 MG tablet if needed  3.Flonase if needed  4.  5.  6.  ____Fleets enema or Magnesium Citrate as directed.   _x___ Use CHG Soap or sage wipes as directed on instruction sheet   ____ Use inhalers on the day of surgery and bring to hospital day of surgery  ____ Stop Metformin and Janumet 2 days prior to surgery.    ____ Take 1/2 of usual insulin dose the night before surgery and none on the morning     surgery.   _x___ Follow recommendations from Cardiologist, Pulmonologist or PCP regarding          stopping Aspirin, Coumadin, Plavix ,Eliquis, Effient, or Pradaxa, and Pletal.  X____Stop Anti-inflammatories such as Advil, Aleve, Ibuprofen, Motrin, Naproxen, Naprosyn, Goodies powders or aspirin products. OK to take Tylenol and                          Celebrex.   _x___ Stop supplements until after surgery.  But may continue Vitamin D, Vitamin B,       and multivitamin.   ____  Bring C-Pap to the hospital.

## 2018-03-31 LAB — URINE CULTURE
Culture: NO GROWTH
Special Requests: NORMAL

## 2018-03-31 LAB — HEPATITIS PANEL, ACUTE
HCV Ab: >11 s/co ratio — ABNORMAL HIGH (ref 0.0–0.9)
Hep A IgM: NEGATIVE
Hep B C IgM: NEGATIVE
Hepatitis B Surface Ag: NEGATIVE

## 2018-06-10 ENCOUNTER — Other Ambulatory Visit: Payer: Self-pay

## 2018-06-10 ENCOUNTER — Encounter
Admission: RE | Admit: 2018-06-10 | Discharge: 2018-06-10 | Disposition: A | Discharge status: Home / Self Care | Payer: BC Managed Care – PPO | Source: Ambulatory Visit | Attending: Orthopedic Surgery | Admitting: Orthopedic Surgery

## 2018-06-10 DIAGNOSIS — Z01812 Encounter for preprocedural laboratory examination: Secondary | ICD-10-CM | POA: Insufficient documentation

## 2018-06-10 HISTORY — DX: Intraductal carcinoma in situ of unspecified breast: D05.10

## 2018-06-10 HISTORY — DX: Depression, unspecified: F32.A

## 2018-06-10 LAB — URINALYSIS, ROUTINE W REFLEX MICROSCOPIC
Bacteria, UA: NONE SEEN
Bilirubin Urine: NEGATIVE
Glucose, UA: NEGATIVE mg/dL
Ketones, ur: NEGATIVE mg/dL
Leukocytes,Ua: NEGATIVE
Nitrite: NEGATIVE
Protein, ur: NEGATIVE mg/dL
Specific Gravity, Urine: 1.026 (ref 1.005–1.030)
pH: 5 (ref 5.0–8.0)

## 2018-06-10 LAB — PROTIME-INR
INR: 1 (ref 0.8–1.2)
Prothrombin Time: 13.5 seconds (ref 11.4–15.2)

## 2018-06-10 LAB — APTT: aPTT: 31 seconds (ref 24–36)

## 2018-06-10 LAB — TYPE AND SCREEN
ABO/RH(D): O POS
Antibody Screen: NEGATIVE

## 2018-06-10 NOTE — Patient Instructions (Signed)
INSTRUCTIONS FOR SURGERY     Your surgery is scheduled for:  Wednesday, June 22, 2018     To find out your arrival time for the day of surgery,          please call [phone] between 1 pm and 3 pm on : Tuesday, June 21, 2018     When you arrive for surgery, report to the [facility].       Do NOT stop on the first floor to register.    REMEMBER: Instructions that are not followed completely may result in serious medical risk,  up to and including death, or upon the discretion of your surgeon and anesthesiologist,            your surgery may need to be rescheduled.  __X__ 1. Do not eat food after midnight the night before your procedure.                    No gum, candy, lozenger, tic tacs, tums or hard candies.                  ABSOLUTELY NOTHING SOLID IN YOUR MOUTH AFTER MIDNIGHT                    You may drink unlimited clear liquids up to 2 hours before you are scheduled to arrive for surgery.                   Do not drink anything within those 2 hours unless you need to take medicine, then take the                   smallest amount you need.  Clear liquids include:  water, apple juice without pulp,                   any flavor Gatorade, Black coffee, black tea.  Sugar may be added but no dairy/ honey /lemon.                        Broth and jello is not considered a clear liquid.  __x__  2. On the morning of surgery, please brush your teeth with toothpaste and water. You may rinse with                  mouthwash if you wish but DO NOT SWALLOW TOOTHPASTE OR MOUTHWASH  __X___3. NO alcohol for 24 hours before or after surgery.  __x___ 4.  Do NOT smoke or use e-cigarettes for 24 HOURS PRIOR TO SURGERY.                      DO NOT Use any chewable tobacco products for at least 6 hours prior to surgery.  __x___ 5. If you start any new medication after this appointment and prior to surgery, please          Bring it with you on the day of surgery.  ___x__ 6. Notify your doctor if there is any change in your medical condition, such as fever, infection, vomitting,  Diarrhea or any open sores.  __x___ 7.  USE the CHG SOAP as instructed, the night before surgery and the day of surgery.                   Once you have washed with this soap, do NOT use any of the following: Powders, perfumes                    or lotions. Please do not wear make up, hairpins, clips or nail polish. You MAY wear deodorant.                   Men may shave their face and neck.  Women need to shave 48 hours prior to surgery.                   DO NOT wear ANY jewelry on the day of surgery. If there are rings that are too tight to                    remove easily, please address this prior to the surgery day. Piercings need to be removed.                                                                     NO METAL ON YOUR BODY.                    Do NOT bring any valuables.  If you came to Pre-Admit testing then you will not need license,                     insurance card or credit card.  If you will be staying overnight, please either leave your things in                     the car or have your family be responsible for these items.                     [facility] IS NOT RESPONSIBLE FOR BELONGINGS OR VALUABLES.  ___X__ 8. DO NOT wear contact lenses on surgery day.  You may not have dentures,                     Hearing aides, contacts or glasses in the operating room. These items can be                    Placed in the Recovery Room to receive immediately after surgery.  __x___ 9. IF YOU ARE SCHEDULED TO GO HOME ON THE SAME DAY, YOU MUST                   Have someone to drive you home and to stay with you  for the first 24 hours.                    Have an arrangement prior to arriving on surgery day.  ___x__ 10. Take the following medications on the morning of surgery with a sip of water:  1. KLONOPIN                     2. CYMBALTA                     3.                     4.                                          _____ 11.  Follow any instructions provided to you by your surgeon.                        Such as enema, clear liquid bowel prep  _  __  12. STOP COUMADIN / PLAVIX / ELIQUIS / ASPIRIN AS OF:                       THIS INCLUDES BC POWDERS / GOODIES POWDER  __x___ 13. STOP Anti-inflammatories as of: NOW                      This includes IBUPROFEN / MOTRIN / ADVIL / ALEVE/ NAPROXYN                    YOU MAY TAKE TYLENOL ANY TIME PRIOR TO SURGERY.  _X____ 14.  Stop supplements until after surgery.                     This includes:                 You may continue taking Vitamin B12 / Vitamin D3 but do not take on the morning of surgery.  _____ 15. Bring your CPAP machine into preop with you on the morning of surgery.  ______16. If staying overnight, please have appropriate shoes to wear to be able to walk around the unit.                   Wear clean and comfortable clothing to the hospital.  BRING THE POA PAPERS WITH YOU IF COMPLETED AND NOTARIZED.  DON'T [place] PHONE CHARGER

## 2018-06-11 LAB — URINE CULTURE: Culture: NO GROWTH

## 2018-06-17 ENCOUNTER — Other Ambulatory Visit: Payer: Self-pay

## 2018-06-17 ENCOUNTER — Encounter
Admission: RE | Admit: 2018-06-17 | Discharge: 2018-06-17 | Disposition: A | Discharge status: Home / Self Care | Payer: BC Managed Care – PPO | Source: Ambulatory Visit | Attending: Orthopedic Surgery | Admitting: Orthopedic Surgery

## 2018-06-17 DIAGNOSIS — Z1159 Encounter for screening for other viral diseases: Secondary | ICD-10-CM | POA: Diagnosis not present

## 2018-06-17 DIAGNOSIS — Z01812 Encounter for preprocedural laboratory examination: Secondary | ICD-10-CM | POA: Insufficient documentation

## 2018-06-18 LAB — NOVEL CORONAVIRUS, NAA (HOSP ORDER, SEND-OUT TO REF LAB; TAT 18-24 HRS): SARS-CoV-2, NAA: NOT DETECTED

## 2018-06-21 MED ORDER — TRANEXAMIC ACID-NACL 1000-0.7 MG/100ML-% IV SOLN
1000.0000 mg | INTRAVENOUS | Status: DC
  Filled 2018-06-21: qty 100

## 2018-06-21 MED ORDER — CEFAZOLIN SODIUM-DEXTROSE 2-4 GM/100ML-% IV SOLN: 2.0000 g | INTRAVENOUS | Status: DC

## 2018-06-22 ENCOUNTER — Other Ambulatory Visit: Payer: Self-pay

## 2018-06-22 ENCOUNTER — Inpatient Hospital Stay: Payer: BC Managed Care – PPO

## 2018-06-22 ENCOUNTER — Encounter
Admission: RE | Disposition: A | Discharge status: Home Health | Payer: Self-pay | Source: Home / Self Care | Attending: Orthopedic Surgery

## 2018-06-22 ENCOUNTER — Inpatient Hospital Stay
Admission: RE | Admit: 2018-06-22 | Discharge: 2018-06-24 | DRG: 470 | Disposition: A | Discharge status: Home Health | Payer: BC Managed Care – PPO | Attending: Orthopedic Surgery | Admitting: Orthopedic Surgery

## 2018-06-22 ENCOUNTER — Encounter: Payer: Self-pay | Admitting: Orthopedic Surgery

## 2018-06-22 ENCOUNTER — Inpatient Hospital Stay: Payer: BC Managed Care – PPO | Admitting: Anesthesiology

## 2018-06-22 DIAGNOSIS — Z7983 Long term (current) use of bisphosphonates: Secondary | ICD-10-CM

## 2018-06-22 DIAGNOSIS — Z7951 Long term (current) use of inhaled steroids: Secondary | ICD-10-CM

## 2018-06-22 DIAGNOSIS — Z96659 Presence of unspecified artificial knee joint: Secondary | ICD-10-CM

## 2018-06-22 DIAGNOSIS — F329 Major depressive disorder, single episode, unspecified: Secondary | ICD-10-CM | POA: Diagnosis present

## 2018-06-22 DIAGNOSIS — F419 Anxiety disorder, unspecified: Secondary | ICD-10-CM | POA: Diagnosis present

## 2018-06-22 DIAGNOSIS — Z885 Allergy status to narcotic agent status: Secondary | ICD-10-CM | POA: Diagnosis not present

## 2018-06-22 DIAGNOSIS — M25761 Osteophyte, right knee: Secondary | ICD-10-CM | POA: Diagnosis present

## 2018-06-22 DIAGNOSIS — Z79899 Other long term (current) drug therapy: Secondary | ICD-10-CM | POA: Diagnosis not present

## 2018-06-22 DIAGNOSIS — Z8269 Family history of other diseases of the musculoskeletal system and connective tissue: Secondary | ICD-10-CM

## 2018-06-22 DIAGNOSIS — M810 Age-related osteoporosis without current pathological fracture: Secondary | ICD-10-CM | POA: Diagnosis present

## 2018-06-22 DIAGNOSIS — Z888 Allergy status to other drugs, medicaments and biological substances status: Secondary | ICD-10-CM | POA: Diagnosis not present

## 2018-06-22 DIAGNOSIS — M1711 Unilateral primary osteoarthritis, right knee: Principal | ICD-10-CM | POA: Diagnosis present

## 2018-06-22 DIAGNOSIS — Z01810 Encounter for preprocedural cardiovascular examination: Secondary | ICD-10-CM

## 2018-06-22 HISTORY — PX: KNEE ARTHROPLASTY: SHX992

## 2018-06-22 SURGERY — ARTHROPLASTY, KNEE, TOTAL, USING IMAGELESS COMPUTER-ASSISTED NAVIGATION
Anesthesia: General | Laterality: Right

## 2018-06-22 MED ORDER — MIDAZOLAM HCL 2 MG/2ML IJ SOLN
INTRAMUSCULAR | Status: AC
  Filled 2018-06-22: qty 2

## 2018-06-22 MED ORDER — ACETAMINOPHEN 10 MG/ML IV SOLN
INTRAVENOUS | Status: AC
  Filled 2018-06-22: qty 100

## 2018-06-22 MED ORDER — DIPHENHYDRAMINE HCL 12.5 MG/5ML PO ELIX: 12.5000 mg | ORAL_SOLUTION | ORAL | Status: DC | PRN

## 2018-06-22 MED ORDER — ROCURONIUM BROMIDE 100 MG/10ML IV SOLN
INTRAVENOUS | Status: DC | PRN
  Administered 2018-06-22: 50 mg via INTRAVENOUS

## 2018-06-22 MED ORDER — PHENOL 1.4 % MT LIQD
1.0000 | OROMUCOSAL | Status: DC | PRN
  Filled 2018-06-22: qty 177

## 2018-06-22 MED ORDER — CEFAZOLIN SODIUM-DEXTROSE 2-4 GM/100ML-% IV SOLN
INTRAVENOUS | Status: AC
  Filled 2018-06-22: qty 100

## 2018-06-22 MED ORDER — CELECOXIB 200 MG PO CAPS
ORAL_CAPSULE | ORAL | Status: AC
  Administered 2018-06-22: 10:00:00 400 mg via ORAL
  Filled 2018-06-22: qty 2

## 2018-06-22 MED ORDER — ACYCLOVIR 200 MG PO CAPS
400.0000 mg | ORAL_CAPSULE | Freq: Two times a day (BID) | ORAL | Status: DC
  Administered 2018-06-24: 400 mg via ORAL
  Filled 2018-06-22 (×5): qty 2

## 2018-06-22 MED ORDER — FAMOTIDINE 20 MG PO TABS
20.0000 mg | ORAL_TABLET | Freq: Once | ORAL | Status: AC
  Administered 2018-06-22: 20 mg via ORAL

## 2018-06-22 MED ORDER — SUGAMMADEX SODIUM 200 MG/2ML IV SOLN
INTRAVENOUS | Status: AC
  Filled 2018-06-22: qty 2

## 2018-06-22 MED ORDER — BUPIVACAINE HCL (PF) 0.25 % IJ SOLN
INTRAMUSCULAR | Status: DC | PRN
  Administered 2018-06-22: 60 mL

## 2018-06-22 MED ORDER — LIDOCAINE HCL (CARDIAC) PF 100 MG/5ML IV SOSY
PREFILLED_SYRINGE | INTRAVENOUS | Status: DC | PRN
  Administered 2018-06-22: 80 mg via INTRAVENOUS

## 2018-06-22 MED ORDER — HYDROMORPHONE HCL 1 MG/ML IJ SOLN: 0.2500 mg | INTRAMUSCULAR | Status: DC | PRN

## 2018-06-22 MED ORDER — DEXAMETHASONE SODIUM PHOSPHATE 10 MG/ML IJ SOLN
INTRAMUSCULAR | Status: DC | PRN
  Administered 2018-06-22: 5 mg via INTRAVENOUS

## 2018-06-22 MED ORDER — CELECOXIB 200 MG PO CAPS
200.0000 mg | ORAL_CAPSULE | Freq: Two times a day (BID) | ORAL | Status: DC
  Administered 2018-06-22 – 2018-06-24 (×4): 200 mg via ORAL
  Filled 2018-06-22 (×4): qty 1

## 2018-06-22 MED ORDER — FENTANYL CITRATE (PF) 100 MCG/2ML IJ SOLN
INTRAMUSCULAR | Status: AC
  Administered 2018-06-22: 18:00:00 50 ug via INTRAVENOUS
  Filled 2018-06-22: qty 2

## 2018-06-22 MED ORDER — OXYCODONE HCL 5 MG PO TABS
5.0000 mg | ORAL_TABLET | ORAL | Status: DC | PRN
  Administered 2018-06-22 – 2018-06-23 (×4): 10 mg via ORAL
  Administered 2018-06-23: 5 mg via ORAL
  Filled 2018-06-22 (×4): qty 2
  Filled 2018-06-22: qty 1
  Filled 2018-06-22: qty 2

## 2018-06-22 MED ORDER — CEFAZOLIN SODIUM-DEXTROSE 2-4 GM/100ML-% IV SOLN
2.0000 g | Freq: Four times a day (QID) | INTRAVENOUS | Status: AC
  Administered 2018-06-22 – 2018-06-23 (×4): 2 g via INTRAVENOUS
  Filled 2018-06-22 (×4): qty 100

## 2018-06-22 MED ORDER — SCOPOLAMINE 1 MG/3DAYS TD PT72
MEDICATED_PATCH | TRANSDERMAL | Status: AC
  Administered 2018-06-22: 11:00:00 1.5 mg via TRANSDERMAL
  Filled 2018-06-22: qty 1

## 2018-06-22 MED ORDER — ENOXAPARIN SODIUM 30 MG/0.3ML ~~LOC~~ SOLN
30.0000 mg | Freq: Two times a day (BID) | SUBCUTANEOUS | Status: DC
  Administered 2018-06-23 – 2018-06-24 (×3): 30 mg via SUBCUTANEOUS
  Filled 2018-06-22 (×3): qty 0.3

## 2018-06-22 MED ORDER — MIDAZOLAM HCL 5 MG/5ML IJ SOLN
INTRAMUSCULAR | Status: DC | PRN
  Administered 2018-06-22: 2 mg via INTRAVENOUS

## 2018-06-22 MED ORDER — CEFAZOLIN SODIUM-DEXTROSE 2-3 GM-%(50ML) IV SOLR
INTRAVENOUS | Status: DC | PRN
  Administered 2018-06-22: 2 g via INTRAVENOUS

## 2018-06-22 MED ORDER — PROPOFOL 10 MG/ML IV BOLUS
INTRAVENOUS | Status: AC
  Filled 2018-06-22: qty 20

## 2018-06-22 MED ORDER — CHLORHEXIDINE GLUCONATE 4 % EX LIQD: 60.0000 mL | Freq: Once | CUTANEOUS | Status: DC

## 2018-06-22 MED ORDER — FENTANYL CITRATE (PF) 100 MCG/2ML IJ SOLN
INTRAMUSCULAR | Status: AC
  Filled 2018-06-22: qty 2

## 2018-06-22 MED ORDER — DEXAMETHASONE SODIUM PHOSPHATE 10 MG/ML IJ SOLN
8.0000 mg | Freq: Once | INTRAMUSCULAR | Status: AC
  Administered 2018-06-22: 10:00:00 8 mg via INTRAVENOUS

## 2018-06-22 MED ORDER — FLEET ENEMA 7-19 GM/118ML RE ENEM: 1.0000 | ENEMA | Freq: Once | RECTAL | Status: DC | PRN

## 2018-06-22 MED ORDER — MAGNESIUM HYDROXIDE 400 MG/5ML PO SUSP
30.0000 mL | Freq: Every day | ORAL | Status: DC
  Administered 2018-06-23: 10:00:00 30 mL via ORAL
  Filled 2018-06-22 (×2): qty 30

## 2018-06-22 MED ORDER — SENNOSIDES-DOCUSATE SODIUM 8.6-50 MG PO TABS
1.0000 | ORAL_TABLET | Freq: Two times a day (BID) | ORAL | Status: DC
  Administered 2018-06-22 – 2018-06-24 (×4): 1 via ORAL
  Filled 2018-06-22 (×4): qty 1

## 2018-06-22 MED ORDER — CELECOXIB 200 MG PO CAPS
400.0000 mg | ORAL_CAPSULE | Freq: Once | ORAL | Status: AC
  Administered 2018-06-22: 400 mg via ORAL

## 2018-06-22 MED ORDER — OXYCODONE HCL 5 MG PO TABS: 5.0000 mg | ORAL_TABLET | ORAL | Status: DC | PRN

## 2018-06-22 MED ORDER — DEXAMETHASONE SODIUM PHOSPHATE 10 MG/ML IJ SOLN
INTRAMUSCULAR | Status: AC
  Filled 2018-06-22: qty 1

## 2018-06-22 MED ORDER — ONDANSETRON HCL 4 MG PO TABS: 4.0000 mg | ORAL_TABLET | Freq: Four times a day (QID) | ORAL | Status: DC | PRN

## 2018-06-22 MED ORDER — TRAMADOL HCL 50 MG PO TABS: 50.0000 mg | ORAL_TABLET | ORAL | Status: DC | PRN

## 2018-06-22 MED ORDER — TRANEXAMIC ACID-NACL 1000-0.7 MG/100ML-% IV SOLN
1000.0000 mg | Freq: Once | INTRAVENOUS | Status: AC
  Administered 2018-06-22: 16:00:00 1000 mg via INTRAVENOUS
  Filled 2018-06-22: qty 100

## 2018-06-22 MED ORDER — PANTOPRAZOLE SODIUM 40 MG PO TBEC
40.0000 mg | DELAYED_RELEASE_TABLET | Freq: Two times a day (BID) | ORAL | Status: DC
  Administered 2018-06-22 – 2018-06-24 (×4): 40 mg via ORAL
  Filled 2018-06-22 (×4): qty 1

## 2018-06-22 MED ORDER — NEOMYCIN-POLYMYXIN B GU 40-200000 IR SOLN
Status: AC
  Filled 2018-06-22: qty 20

## 2018-06-22 MED ORDER — ACETAMINOPHEN 10 MG/ML IV SOLN
1000.0000 mg | Freq: Four times a day (QID) | INTRAVENOUS | Status: AC
  Administered 2018-06-22 – 2018-06-23 (×4): 1000 mg via INTRAVENOUS
  Filled 2018-06-22 (×4): qty 100

## 2018-06-22 MED ORDER — AZELASTINE HCL 0.1 % NA SOLN
2.0000 | Freq: Two times a day (BID) | NASAL | Status: DC
  Filled 2018-06-22: qty 30

## 2018-06-22 MED ORDER — FENTANYL CITRATE (PF) 100 MCG/2ML IJ SOLN
25.0000 ug | INTRAMUSCULAR | Status: DC | PRN
  Administered 2018-06-22: 50 ug via INTRAVENOUS
  Administered 2018-06-22: 16:00:00 25 ug via INTRAVENOUS
  Administered 2018-06-22 (×2): 50 ug via INTRAVENOUS
  Administered 2018-06-22: 25 ug via INTRAVENOUS

## 2018-06-22 MED ORDER — MENTHOL 3 MG MT LOZG
1.0000 | LOZENGE | OROMUCOSAL | Status: DC | PRN
  Filled 2018-06-22: qty 9

## 2018-06-22 MED ORDER — FLUTICASONE PROPIONATE 50 MCG/ACT NA SUSP
2.0000 | Freq: Every day | NASAL | Status: DC | PRN
  Filled 2018-06-22: qty 16

## 2018-06-22 MED ORDER — PHENYLEPHRINE HCL (PRESSORS) 10 MG/ML IV SOLN
INTRAVENOUS | Status: AC
  Filled 2018-06-22: qty 1

## 2018-06-22 MED ORDER — TAPENTADOL HCL 50 MG PO TABS
50.0000 mg | ORAL_TABLET | ORAL | Status: DC | PRN
  Administered 2018-06-22: 20:00:00 100 mg via ORAL
  Filled 2018-06-22: qty 2

## 2018-06-22 MED ORDER — OXYCODONE HCL 5 MG PO TABS: 10.0000 mg | ORAL_TABLET | ORAL | Status: DC | PRN

## 2018-06-22 MED ORDER — METOCLOPRAMIDE HCL 10 MG PO TABS
10.0000 mg | ORAL_TABLET | Freq: Three times a day (TID) | ORAL | Status: DC
  Administered 2018-06-22 – 2018-06-24 (×6): 10 mg via ORAL
  Filled 2018-06-22 (×6): qty 1

## 2018-06-22 MED ORDER — LIDOCAINE HCL (PF) 2 % IJ SOLN
INTRAMUSCULAR | Status: AC
  Filled 2018-06-22: qty 10

## 2018-06-22 MED ORDER — ALUM & MAG HYDROXIDE-SIMETH 200-200-20 MG/5ML PO SUSP: 30.0000 mL | ORAL | Status: DC | PRN

## 2018-06-22 MED ORDER — ACETAMINOPHEN 325 MG PO TABS: 325.0000 mg | ORAL_TABLET | Freq: Four times a day (QID) | ORAL | Status: DC | PRN

## 2018-06-22 MED ORDER — PROPOFOL 500 MG/50ML IV EMUL
INTRAVENOUS | Status: AC
  Filled 2018-06-22: qty 50

## 2018-06-22 MED ORDER — GABAPENTIN 300 MG PO CAPS
ORAL_CAPSULE | ORAL | Status: AC
  Administered 2018-06-22: 10:00:00 300 mg via ORAL
  Filled 2018-06-22: qty 1

## 2018-06-22 MED ORDER — HYDROMORPHONE HCL 1 MG/ML IJ SOLN: 0.5000 mg | INTRAMUSCULAR | Status: DC | PRN

## 2018-06-22 MED ORDER — ALBUTEROL SULFATE (2.5 MG/3ML) 0.083% IN NEBU: 2.5000 mg | INHALATION_SOLUTION | Freq: Four times a day (QID) | RESPIRATORY_TRACT | Status: DC | PRN

## 2018-06-22 MED ORDER — ONDANSETRON HCL 4 MG/2ML IJ SOLN: 4.0000 mg | Freq: Four times a day (QID) | INTRAMUSCULAR | Status: DC | PRN

## 2018-06-22 MED ORDER — BUPIVACAINE HCL (PF) 0.25 % IJ SOLN
INTRAMUSCULAR | Status: AC
  Filled 2018-06-22: qty 60

## 2018-06-22 MED ORDER — DULOXETINE HCL 60 MG PO CPEP
90.0000 mg | ORAL_CAPSULE | Freq: Every day | ORAL | Status: DC
  Administered 2018-06-23 – 2018-06-24 (×2): 90 mg via ORAL
  Filled 2018-06-22 (×2): qty 1

## 2018-06-22 MED ORDER — BUPIVACAINE LIPOSOME 1.3 % IJ SUSP
INTRAMUSCULAR | Status: AC
  Filled 2018-06-22: qty 20

## 2018-06-22 MED ORDER — NEOMYCIN-POLYMYXIN B GU 40-200000 IR SOLN
Status: DC | PRN
  Administered 2018-06-22: 2 mL
  Administered 2018-06-22: 12 mL

## 2018-06-22 MED ORDER — FAMOTIDINE 20 MG PO TABS
ORAL_TABLET | ORAL | Status: AC
  Administered 2018-06-22: 10:00:00 20 mg via ORAL
  Filled 2018-06-22: qty 1

## 2018-06-22 MED ORDER — VITAMIN D 25 MCG (1000 UNIT) PO TABS
2000.0000 [IU] | ORAL_TABLET | Freq: Every day | ORAL | Status: DC
  Administered 2018-06-23 – 2018-06-24 (×2): 2000 [IU] via ORAL
  Filled 2018-06-22: qty 2

## 2018-06-22 MED ORDER — HYOSCYAMINE SULFATE 0.125 MG PO TBDP
0.1250 mg | ORAL_TABLET | ORAL | Status: DC | PRN
  Filled 2018-06-22: qty 1

## 2018-06-22 MED ORDER — METOCLOPRAMIDE HCL 5 MG/ML IJ SOLN: 5.0000 mg | Freq: Three times a day (TID) | INTRAMUSCULAR | Status: DC | PRN

## 2018-06-22 MED ORDER — DEXAMETHASONE SODIUM PHOSPHATE 10 MG/ML IJ SOLN
INTRAMUSCULAR | Status: AC
  Administered 2018-06-22: 10:00:00 8 mg via INTRAVENOUS
  Filled 2018-06-22: qty 1

## 2018-06-22 MED ORDER — CLONAZEPAM 1 MG PO TABS
1.0000 mg | ORAL_TABLET | Freq: Two times a day (BID) | ORAL | Status: DC | PRN
  Administered 2018-06-23 (×2): 1 mg via ORAL
  Filled 2018-06-22 (×2): qty 1

## 2018-06-22 MED ORDER — LORATADINE 10 MG PO TABS
10.0000 mg | ORAL_TABLET | Freq: Every day | ORAL | Status: DC
  Administered 2018-06-23: 10:00:00 10 mg via ORAL
  Filled 2018-06-22 (×2): qty 1

## 2018-06-22 MED ORDER — SODIUM CHLORIDE FLUSH 0.9 % IV SOLN
INTRAVENOUS | Status: AC
  Filled 2018-06-22: qty 40

## 2018-06-22 MED ORDER — GABAPENTIN 300 MG PO CAPS
300.0000 mg | ORAL_CAPSULE | Freq: Once | ORAL | Status: AC
  Administered 2018-06-22: 300 mg via ORAL

## 2018-06-22 MED ORDER — RISAQUAD PO CAPS
1.0000 | ORAL_CAPSULE | Freq: Every day | ORAL | Status: DC
  Administered 2018-06-23: 10:00:00 1 via ORAL
  Filled 2018-06-22 (×3): qty 1

## 2018-06-22 MED ORDER — FERROUS SULFATE 325 (65 FE) MG PO TABS
325.0000 mg | ORAL_TABLET | Freq: Two times a day (BID) | ORAL | Status: DC
  Administered 2018-06-23 – 2018-06-24 (×3): 325 mg via ORAL
  Filled 2018-06-22 (×3): qty 1

## 2018-06-22 MED ORDER — PROPOFOL 500 MG/50ML IV EMUL
INTRAVENOUS | Status: DC | PRN
  Administered 2018-06-22: 150 ug/kg/min via INTRAVENOUS

## 2018-06-22 MED ORDER — FENTANYL CITRATE (PF) 100 MCG/2ML IJ SOLN
INTRAMUSCULAR | Status: DC | PRN
  Administered 2018-06-22: 50 ug via INTRAVENOUS
  Administered 2018-06-22: 100 ug via INTRAVENOUS
  Administered 2018-06-22 (×2): 50 ug via INTRAVENOUS

## 2018-06-22 MED ORDER — BISACODYL 10 MG RE SUPP
10.0000 mg | Freq: Every day | RECTAL | Status: DC | PRN
  Administered 2018-06-24: 10:00:00 10 mg via RECTAL
  Filled 2018-06-22: qty 1

## 2018-06-22 MED ORDER — ACETAMINOPHEN 10 MG/ML IV SOLN
INTRAVENOUS | Status: DC | PRN
  Administered 2018-06-22: 1000 mg via INTRAVENOUS

## 2018-06-22 MED ORDER — SUGAMMADEX SODIUM 200 MG/2ML IV SOLN
INTRAVENOUS | Status: DC | PRN
  Administered 2018-06-22: 150 mg via INTRAVENOUS

## 2018-06-22 MED ORDER — METOCLOPRAMIDE HCL 10 MG PO TABS: 5.0000 mg | ORAL_TABLET | Freq: Three times a day (TID) | ORAL | Status: DC | PRN

## 2018-06-22 MED ORDER — SODIUM CHLORIDE 0.9 % IV SOLN
INTRAVENOUS | Status: DC
  Administered 2018-06-22 – 2018-06-23 (×2): via INTRAVENOUS

## 2018-06-22 MED ORDER — FENTANYL CITRATE (PF) 100 MCG/2ML IJ SOLN
INTRAMUSCULAR | Status: AC
  Administered 2018-06-22: 16:00:00 50 ug via INTRAVENOUS
  Filled 2018-06-22: qty 2

## 2018-06-22 MED ORDER — SODIUM CHLORIDE 0.9 % IV SOLN
INTRAVENOUS | Status: DC | PRN
  Administered 2018-06-22: 60 mL

## 2018-06-22 MED ORDER — LACTATED RINGERS IV SOLN
INTRAVENOUS | Status: DC
  Administered 2018-06-22: 10:00:00 via INTRAVENOUS

## 2018-06-22 MED ORDER — GABAPENTIN 300 MG PO CAPS
300.0000 mg | ORAL_CAPSULE | Freq: Every day | ORAL | Status: DC
  Administered 2018-06-22 – 2018-06-23 (×2): 300 mg via ORAL
  Filled 2018-06-22 (×2): qty 1

## 2018-06-22 MED ORDER — SODIUM CHLORIDE 0.9 % IV SOLN
INTRAVENOUS | Status: DC | PRN
  Administered 2018-06-22: 12:00:00 via INTRAVENOUS

## 2018-06-22 MED ORDER — TRANEXAMIC ACID-NACL 1000-0.7 MG/100ML-% IV SOLN
INTRAVENOUS | Status: DC | PRN
  Administered 2018-06-22: 1000 mg via INTRAVENOUS

## 2018-06-22 MED ORDER — ROCURONIUM BROMIDE 50 MG/5ML IV SOLN
INTRAVENOUS | Status: AC
  Filled 2018-06-22: qty 1

## 2018-06-22 MED ORDER — SCOPOLAMINE 1 MG/3DAYS TD PT72
1.0000 | MEDICATED_PATCH | Freq: Once | TRANSDERMAL | Status: DC
  Administered 2018-06-22: 11:00:00 1.5 mg via TRANSDERMAL

## 2018-06-22 MED ORDER — PROPOFOL 10 MG/ML IV BOLUS
INTRAVENOUS | Status: DC | PRN
  Administered 2018-06-22: 140 mg via INTRAVENOUS

## 2018-06-22 MED ORDER — PROMETHAZINE HCL 25 MG/ML IJ SOLN: 6.2500 mg | INTRAMUSCULAR | Status: DC | PRN

## 2018-06-22 SURGICAL SUPPLY — 67 items
ATTUNE PS FEM RT SZ 4 CEM KNEE (Femur) ×3 IMPLANT
ATTUNE PSRP INSR SZ4 5 KNEE (Insert) ×2 IMPLANT
ATTUNE PSRP INSR SZ4 5MM KNEE (Insert) ×1 IMPLANT
BASEPLATE TIBIAL ROTATING SZ 4 (Knees) ×3 IMPLANT
BATTERY INSTRU NAVIGATION (MISCELLANEOUS) ×12 IMPLANT
BLADE SAW 70X12.5 (BLADE) ×3 IMPLANT
BLADE SAW 90X13X1.19 OSCILLAT (BLADE) ×3 IMPLANT
BLADE SAW 90X25X1.19 OSCILLAT (BLADE) ×3 IMPLANT
BONE CEMENT GENTAMICIN (Cement) ×6 IMPLANT
CANISTER SUCT 3000ML PPV (MISCELLANEOUS) ×3 IMPLANT
CEMENT BONE GENTAMICIN 40 (Cement) ×2 IMPLANT
COOLER POLAR GLACIER W/PUMP (MISCELLANEOUS) ×3 IMPLANT
COVER WAND RF STERILE (DRAPES) ×3 IMPLANT
CUFF TOURN SGL QUICK 24 (TOURNIQUET CUFF)
CUFF TOURN SGL QUICK 30 (TOURNIQUET CUFF)
CUFF TRNQT CYL 24X4X16.5-23 (TOURNIQUET CUFF) IMPLANT
CUFF TRNQT CYL 30X4X21-28X (TOURNIQUET CUFF) IMPLANT
DRAPE SHEET LG 3/4 BI-LAMINATE (DRAPES) ×3 IMPLANT
DRSG DERMACEA 8X12 NADH (GAUZE/BANDAGES/DRESSINGS) ×3 IMPLANT
DRSG OPSITE POSTOP 4X14 (GAUZE/BANDAGES/DRESSINGS) ×3 IMPLANT
DRSG TEGADERM 4X4.75 (GAUZE/BANDAGES/DRESSINGS) ×3 IMPLANT
DURAPREP 26ML APPLICATOR (WOUND CARE) ×6 IMPLANT
ELECT REM PT RETURN 9FT ADLT (ELECTROSURGICAL) ×3
ELECTRODE REM PT RTRN 9FT ADLT (ELECTROSURGICAL) ×1 IMPLANT
EX-PIN ORTHOLOCK NAV 4X150 (PIN) ×6 IMPLANT
GLOVE BIOGEL M STRL SZ7.5 (GLOVE) ×6 IMPLANT
GLOVE INDICATOR 8.0 STRL GRN (GLOVE) ×3 IMPLANT
GOWN STRL REUS W/ TWL LRG LVL3 (GOWN DISPOSABLE) ×2 IMPLANT
GOWN STRL REUS W/TWL LRG LVL3 (GOWN DISPOSABLE) ×4
HEMOVAC 400CC 10FR (MISCELLANEOUS) ×3 IMPLANT
HOLDER FOLEY CATH W/STRAP (MISCELLANEOUS) ×3 IMPLANT
HOOD PEEL AWAY FLYTE STAYCOOL (MISCELLANEOUS) ×6 IMPLANT
KIT TURNOVER KIT A (KITS) ×3 IMPLANT
KNIFE SCULPS 14X20 (INSTRUMENTS) ×3 IMPLANT
LABEL OR SOLS (LABEL) ×3 IMPLANT
MANIFOLD NEPTUNE WASTE (CANNULA) ×3 IMPLANT
NDL SAFETY ECLIPSE 18X1.5 (NEEDLE) ×1 IMPLANT
NEEDLE HYPO 18GX1.5 SHARP (NEEDLE) ×2
NEEDLE SPNL 20GX3.5 QUINCKE YW (NEEDLE) ×6 IMPLANT
NS IRRIG 500ML POUR BTL (IV SOLUTION) ×3 IMPLANT
PACK TOTAL KNEE (MISCELLANEOUS) ×3 IMPLANT
PAD WRAPON POLAR KNEE (MISCELLANEOUS) ×1 IMPLANT
PATELLA MEDIAL ATTUN 35MM KNEE (Knees) ×3 IMPLANT
PENCIL SMOKE ULTRAEVAC 22 CON (MISCELLANEOUS) ×3 IMPLANT
PIN DRILL QUICK PACK ×6 IMPLANT
PIN FIXATION 1/8DIA X 3INL (PIN) ×9 IMPLANT
PULSAVAC PLUS IRRIG FAN TIP (DISPOSABLE) ×3
SOL .9 NS 3000ML IRR  AL (IV SOLUTION) ×2
SOL .9 NS 3000ML IRR UROMATIC (IV SOLUTION) ×1 IMPLANT
SOL PREP PVP 2OZ (MISCELLANEOUS) ×3
SOLUTION PREP PVP 2OZ (MISCELLANEOUS) ×1 IMPLANT
SPONGE DRAIN TRACH 4X4 STRL 2S (GAUZE/BANDAGES/DRESSINGS) ×3 IMPLANT
STAPLER SKIN PROX 35W (STAPLE) ×3 IMPLANT
STOCKINETTE IMPERV 14X48 (MISCELLANEOUS) IMPLANT
STRAP TIBIA SHORT (MISCELLANEOUS) ×3 IMPLANT
SUCTION FRAZIER HANDLE 10FR (MISCELLANEOUS) ×2
SUCTION TUBE FRAZIER 10FR DISP (MISCELLANEOUS) ×1 IMPLANT
SUT VIC AB 0 CT1 36 (SUTURE) ×6 IMPLANT
SUT VIC AB 1 CT1 36 (SUTURE) ×6 IMPLANT
SUT VIC AB 2-0 CT2 27 (SUTURE) ×3 IMPLANT
SYR 20CC LL (SYRINGE) ×3 IMPLANT
SYR 30ML LL (SYRINGE) ×6 IMPLANT
TIP FAN IRRIG PULSAVAC PLUS (DISPOSABLE) ×1 IMPLANT
TOWEL OR 17X26 4PK STRL BLUE (TOWEL DISPOSABLE) ×3 IMPLANT
TOWER CARTRIDGE SMART MIX (DISPOSABLE) ×3 IMPLANT
TRAY FOLEY MTR SLVR 16FR STAT (SET/KITS/TRAYS/PACK) ×3 IMPLANT
WRAPON POLAR PAD KNEE (MISCELLANEOUS) ×3

## 2018-06-22 NOTE — Anesthesia Preprocedure Evaluation (Signed)
Anesthesia Evaluation  Patient identified by MRN, date of birth, ID band Patient awake    Reviewed: Allergy & Precautions, H&P , NPO status , Patient's Chart, lab work & pertinent test results  History of Anesthesia Complications Negative for: history of anesthetic complications  Airway Mallampati: II  TM Distance: >3 FB Neck ROM: full    Dental  (+) Chipped, Poor Dentition, Missing   Pulmonary neg pulmonary ROS, neg shortness of breath,           Cardiovascular Exercise Tolerance: Good (-) angina(-) Past MI and (-) DOE negative cardio ROS       Neuro/Psych PSYCHIATRIC DISORDERS negative neurological ROS     GI/Hepatic GERD  Medicated and Controlled,(+) Hepatitis -  Endo/Other  negative endocrine ROS  Renal/GU      Musculoskeletal  (+) Arthritis ,   Abdominal   Peds  Hematology negative hematology ROS (+)   Anesthesia Other Findings Past Medical History: No date: Anxiety No date: Arthritis No date: Cancer [facility])     Comment:  breast cancer. had bilateral mastectomies No date: DCIS (ductal carcinoma in situ) No date: Depression No date: GERD (gastroesophageal reflux disease) No date: Hepatitis C     Comment:  cured in 2014 No date: HSV (herpes simplex virus) infection No date: IBS (irritable bowel syndrome)  Past Surgical History: No date: arthroscopic knee surgery     Comment:  x2 No date: BREAST SURGERY     Comment:  mastectomies then reconstructive surgery times 2. No date: CESAREAN SECTION 10/1998: CHOLECYSTECTOMY No date: FOOT SURGERY No date: knee surgery with nerve repair No date: MASTECTOMY, MODIFIED RADICAL W/RECONSTRUCTION No date: TONSILLECTOMY 1999: TOTAL ABDOMINAL HYSTERECTOMY W/ BILATERAL SALPINGOOPHORECTOMY  BMI    Body Mass Index:  25.20 kg/m      Reproductive/Obstetrics negative OB ROS                             Anesthesia Physical Anesthesia  Plan  ASA: III  Anesthesia Plan: General ETT   Post-op Pain Management:    Induction: Intravenous  PONV Risk Score and Plan: Ondansetron, Dexamethasone, Midazolam and Treatment may vary due to age or medical condition  Airway Management Planned: Oral ETT  Additional Equipment:   Intra-op Plan:   Post-operative Plan: Extubation in OR  Informed Consent: I have reviewed the patients History and Physical, chart, labs and discussed the procedure including the risks, benefits and alternatives for the proposed anesthesia with the patient or authorized representative who has indicated his/her understanding and acceptance.     Dental Advisory Given  Plan Discussed with: Anesthesiologist, CRNA and Surgeon  Anesthesia Plan Comments: (Patient with chronic back pain who declines spinal  Patient consented for risks of anesthesia including but not limited to:  - adverse reactions to medications - damage to teeth, lips or other oral mucosa - sore throat or hoarseness - Damage to heart, brain, lungs or loss of life  Patient voiced understanding.)        Anesthesia Quick Evaluation

## 2018-06-22 NOTE — Anesthesia Postprocedure Evaluation (Signed)
Anesthesia Post Note  Patient: Belinda Norman  Procedure(s) Performed: COMPUTER ASSISTED TOTAL KNEE ARTHROPLASTY RIGHT - Will need an RNFA (Right )  Patient location during evaluation: PACU Anesthesia Type: General Level of consciousness: awake and alert Pain management: pain level controlled Vital Signs Assessment: post-procedure vital signs reviewed and stable Respiratory status: spontaneous breathing, nonlabored ventilation, respiratory function stable and patient connected to nasal cannula oxygen Cardiovascular status: blood pressure returned to baseline and stable Postop Assessment: no apparent nausea or vomiting Anesthetic complications: no     Last Vitals:  Vitals:   06/22/18 1815 06/22/18 2101  BP: 93/63 97/68  Pulse: 88 81  Resp: 16 18  Temp: 37.1 C 37 C  SpO2: 100% 100%    Last Pain:  Vitals:   06/22/18 2022  TempSrc:   PainSc: 8                  Martha Clan

## 2018-06-22 NOTE — Transfer of Care (Signed)
Immediate Anesthesia Transfer of Care Note  Patient: Belinda Norman  Procedure(s) Performed: COMPUTER ASSISTED TOTAL KNEE ARTHROPLASTY RIGHT - Will need an RNFA (Right )  Patient Location: PACU  Anesthesia Type:General  Level of Consciousness: sedated  Airway & Oxygen Therapy: Patient connected to face mask oxygen  Post-op Assessment: Post -op Vital signs reviewed and stable  Post vital signs: stable  Last Vitals:  Vitals Value Taken Time  BP 121/82 06/22/2018  3:40 PM  Temp    Pulse 93 06/22/2018  3:40 PM  Resp 16 06/22/2018  3:40 PM  SpO2 100 % 06/22/2018  3:40 PM    Last Pain:  Vitals:   06/22/18 1013  TempSrc: Temporal  PainSc: 0-No pain         Complications: No apparent anesthesia complications

## 2018-06-22 NOTE — H&P (Signed)
The patient has been re-examined, and the chart reviewed, and there have been no interval changes to the documented history and physical.    The risks, benefits, and alternatives have been discussed at length. The patient expressed understanding of the risks benefits and agreed with plans for surgical intervention.  Belinda Arntz P. Eriq Hufford, Jr. M.D.    

## 2018-06-22 NOTE — Op Note (Signed)
OPERATIVE NOTE  DATE OF SURGERY:  06/22/2018  PATIENT NAME:  Belinda Norman   DOB: [birth year]  MRN: [id number]  PRE-OPERATIVE DIAGNOSIS: Degenerative arthrosis of the right knee, primary  POST-OPERATIVE DIAGNOSIS:  Same  PROCEDURE:  Right total knee arthroplasty using computer-assisted navigation  SURGEON:  Marciano Sequin. M.D.  ANESTHESIA: general  ESTIMATED BLOOD LOSS: 50 mL  FLUIDS REPLACED: 1000 mL of crystalloid  TOURNIQUET TIME: 94 minutes  DRAINS: 2 medium Hemovac drains  SOFT TISSUE RELEASES: Anterior cruciate ligament, posterior cruciate ligament, deep medial collateral ligament, patellofemoral ligament  IMPLANTS UTILIZED: DePuy Attune size 4 posterior stabilized femoral component (cemented), size 4 rotating platform tibial component (cemented), 35 mm medialized dome patella (cemented), and a 5 mm stabilized rotating platform polyethylene insert.  INDICATIONS FOR SURGERY: Belinda Norman is a 58 y.o. year old female with a long history of progressive knee pain. X-rays demonstrated severe degenerative changes in tricompartmental fashion. The patient had not seen any significant improvement despite conservative nonsurgical intervention. After discussion of the risks and benefits of surgical intervention, the patient expressed understanding of the risks benefits and agree with plans for total knee arthroplasty.   The risks, benefits, and alternatives were discussed at length including but not limited to the risks of infection, bleeding, nerve injury, stiffness, blood clots, the need for revision surgery, cardiopulmonary complications, among others, and they were willing to proceed.  PROCEDURE IN DETAIL: The patient was brought into the operating room and, after adequate general anesthesia was achieved, a tourniquet was placed on the patient's upper thigh. The patient's knee and leg were cleaned and prepped with alcohol and DuraPrep and draped in the usual sterile fashion. A  "timeout" was performed as per usual protocol. The lower extremity was exsanguinated using an Esmarch, and the tourniquet was inflated to 300 mmHg. An anterior longitudinal incision was made followed by a standard mid vastus approach. The deep fibers of the medial collateral ligament were elevated in a subperiosteal fashion off of the medial flare of the tibia so as to maintain a continuous soft tissue sleeve. The patella was subluxed laterally and the patellofemoral ligament was incised. Inspection of the knee demonstrated severe degenerative changes with full-thickness loss of articular cartilage. Osteophytes were debrided using a rongeur. Anterior and posterior cruciate ligaments were excised. Two 4.0 mm Schanz pins were inserted in the femur and into the tibia for attachment of the array of trackers used for computer-assisted navigation. Hip center was identified using a circumduction technique. Distal landmarks were mapped using the computer. The distal femur and proximal tibia were mapped using the computer. The distal femoral cutting guide was positioned using computer-assisted navigation so as to achieve a 5 distal valgus cut. The femur was sized and it was felt that a size 4 femoral component was appropriate. A size 4 femoral cutting guide was positioned and the anterior cut was performed and verified using the computer. This was followed by completion of the posterior and chamfer cuts. Femoral cutting guide for the central box was then positioned in the center box cut was performed.  Attention was then directed to the proximal tibia. Medial and lateral menisci were excised. The extramedullary tibial cutting guide was positioned using computer-assisted navigation so as to achieve a 0 varus-valgus alignment and 3 posterior slope. The cut was performed and verified using the computer. The proximal tibia was sized and it was felt that a size 4 tibial tray was appropriate. Tibial and femoral trials were  inserted followed  by insertion of a 5 mm polyethylene insert.  The knee was tight in both flexion and extension and lacked full extension.  Trial components were removed and the tibial cutting guide was positioned so as to resect an additional 2 mm of bone from the proximal tibia.  This was done and verified using the computer.  Trial components were reinserted with the 5 mm polyethylene insert.  This allowed for excellent mediolateral soft tissue balancing both in flexion and in full extension. Finally, the patella was cut and prepared so as to accommodate a 35 mm medialized dome patella. A patella trial was placed and the knee was placed through a range of motion with excellent patellar tracking appreciated. The femoral trial was removed after debridement of posterior osteophytes. The central post-hole for the tibial component was reamed followed by insertion of a keel punch. Tibial trials were then removed. Cut surfaces of bone were irrigated with copious amounts of normal saline with antibiotic solution using pulsatile lavage and then suctioned dry. Polymethylmethacrylate cement with gentamicin was prepared in the usual fashion using a vacuum mixer. Cement was applied to the cut surface of the proximal tibia as well as along the undersurface of a size 4 rotating platform tibial component. Tibial component was positioned and impacted into place. Excess cement was removed using [employer] fast streamer. Cement was then applied to the cut surfaces of the femur as well as along the posterior flanges of the size 4 femoral component. The femoral component was positioned and impacted into place. Excess cement was removed using [employer] fast streamer. A 5 mm polyethylene trial was inserted and the knee was brought into full extension with steady axial compression applied. Finally, cement was applied to the backside of a 35 mm medialized dome patella and the patellar component was positioned and patellar clamp applied. Excess cement was  removed using [employer] fast streamer. After adequate curing of the cement, the tourniquet was deflated after a total tourniquet time of 94 minutes. Hemostasis was achieved using electrocautery. The knee was irrigated with copious amounts of normal saline with antibiotic solution using pulsatile lavage and then suctioned dry. 20 mL of 1.3% Exparel and 60 mL of 0.25% Marcaine in 40 mL of normal saline was injected along the posterior capsule, medial and lateral gutters, and along the arthrotomy site. A 5 mm stabilized rotating platform polyethylene insert was inserted and the knee was placed through a range of motion with excellent mediolateral soft tissue balancing appreciated and excellent patellar tracking noted. 2 medium drains were placed in the wound bed and brought out through separate stab incisions. The medial parapatellar portion of the incision was reapproximated using interrupted sutures of #1 Vicryl. Subcutaneous tissue was approximated in layers using first #0 Vicryl followed #2-0 Vicryl. The skin was approximated with skin staples. A sterile dressing was applied.  The patient tolerated the procedure well and was transported to the recovery room in stable condition.    Alveria Mcglaughlin P. Holley Bouche., M.D.

## 2018-06-22 NOTE — Progress Notes (Signed)
Pt admitted to rm 155. VSS. Pt is very drowsy, falls asleep during conversations on the phone with family. Complains of pain, RN explained to patient that she may need to wait a little longer for pain control for safety due to drowsiness. Alert to voice. No adventitious heart or lung sounds. Surgical site intact. Has full sensation and can wiggle toes. Will continue to monitor.

## 2018-06-22 NOTE — Anesthesia Post-op Follow-up Note (Signed)
Anesthesia QCDR form completed.        

## 2018-06-22 NOTE — TOC Progression Note (Signed)
Transition of Care [facility]) - Progression Note    Patient Details  Name: Belinda Norman MRN: [id number] Date of Birth: 1960/05/26  [facility] of [facility]) CM/SW Contact  Marily Konczal, Veronia Beets, [place] Phone Number: 06/22/2018, 2:46 PM  Clinical Narrative:   Requested Lovenox price and notified [place] of admission.          Expected Discharge Plan and Services                                                 Social Determinants of Health (SDOH) Interventions    Readmission Risk Interventions No flowsheet data found.

## 2018-06-22 NOTE — Anesthesia Procedure Notes (Signed)
Procedure Name: Intubation Date/Time: 06/22/2018 12:05 PM Performed by: Chanetta Marshall, CRNA Pre-anesthesia Checklist: Patient identified, Emergency Drugs available, Suction available and Patient being monitored Patient Re-evaluated:Patient Re-evaluated prior to induction Oxygen Delivery Method: Circle system utilized Preoxygenation: Pre-oxygenation with 100% oxygen Induction Type: IV induction Ventilation: Mask ventilation without difficulty Laryngoscope Size: Mac and 3 Tube type: Oral Tube size: 7.0 mm Number of attempts: 1 Airway Equipment and Method: Stylet and Oral airway Placement Confirmation: ETT inserted through vocal cords under direct vision,  positive ETCO2,  breath sounds checked- equal and bilateral and CO2 detector Secured at: 21 cm Tube secured with: Tape Dental Injury: Teeth and Oropharynx as per pre-operative assessment

## 2018-06-23 ENCOUNTER — Encounter: Payer: Self-pay | Admitting: Orthopedic Surgery

## 2018-06-23 NOTE — Plan of Care (Signed)
  Problem: Education: Goal: Knowledge of the prescribed therapeutic regimen will improve 06/23/2018 0752 by Vergie Living, RN Outcome: Progressing 06/23/2018 0751 by Vergie Living, RN Outcome: Progressing Goal: Individualized Educational Video(s) 06/23/2018 0752 by Vergie Living, RN Outcome: Progressing 06/23/2018 0751 by Vergie Living, RN Outcome: Progressing   Problem: Activity: Goal: Ability to avoid complications of mobility impairment will improve 06/23/2018 0752 by Jannifer Rodney A, RN Outcome: Progressing 06/23/2018 0751 by Jannifer Rodney A, RN Outcome: Progressing Goal: Range of joint motion will improve 06/23/2018 0752 by Jannifer Rodney A, RN Outcome: Progressing 06/23/2018 0751 by Jannifer Rodney A, RN Outcome: Progressing   Problem: Clinical Measurements: Goal: Postoperative complications will be avoided or minimized 06/23/2018 0752 by Jannifer Rodney A, RN Outcome: Progressing 06/23/2018 0751 by Jannifer Rodney A, RN Outcome: Progressing   Problem: Pain Management: Goal: Pain level will decrease with appropriate interventions 06/23/2018 0752 by Vergie Living, RN Outcome: Progressing 06/23/2018 0751 by Jannifer Rodney A, RN Outcome: Progressing   Problem: Skin Integrity: Goal: Will show signs of wound healing 06/23/2018 0752 by Jannifer Rodney A, RN Outcome: Progressing 06/23/2018 0751 by Vergie Living, RN Outcome: Progressing

## 2018-06-23 NOTE — Progress Notes (Signed)
Physical Therapy Treatment Patient Details Name: Belinda Norman MRN: [id number] DOB: Sep 16, 1960 Today's Date: 06/23/2018    History of Present Illness 58 y/o female s/p R total knee replacement 6/10.  H/o of multiple R knee scopes and interventions with chronic pain issues.    PT Comments    Pt still having significant pain but not nearly as functionally limiting as AM session.  She was able to not only walker farther but with considerably more confidence and improved cadence.  Pt c/o quad pain with quad sets and struggled with active TKE effort but otherwise showed appropriate POD1 effort and improvement.  Good effort and execution this afternoon despite pain hesitancy.     Follow Up Recommendations  Home health PT;Supervision/Assistance - 24 hour     Equipment Recommendations  None recommended by PT    Recommendations for Other Services       Precautions / Restrictions Precautions Precautions: Fall;Knee Restrictions Weight Bearing Restrictions: Yes RLE Weight Bearing: Weight bearing as tolerated    Mobility  Bed Mobility Overal bed mobility: Needs Assistance Bed Mobility: Supine to Sit;Sit to Supine     Supine to sit: Min guard Sit to supine: Min guard   General bed mobility comments: Pt more confident with getting to EOB, moving R LE than AM session  Transfers Overall transfer level: Needs assistance Equipment used: Rolling walker (2 wheeled) Transfers: Sit to/from Stand Sit to Stand: Min guard         General transfer comment: cues again for UE use, appropriate LE set up and general sequencing  Ambulation/Gait Ambulation/Gait assistance: Min assist Gait Distance (Feet): 75 Feet Assistive device: Rolling walker (2 wheeled)       General Gait Details: Pt again with very slow, hesitant gait, but with cuing and encourgagment she showed greatly improved speed and cadence with decreased walker reliance.  Pt still with some mild limp, but able to maintain  forward walker motion even during R stance phase   Stairs             Wheelchair Mobility    Modified Rankin (Stroke Patients Only)       Balance Overall balance assessment: Modified Independent                                          Cognition Arousal/Alertness: Awake/alert Behavior During Therapy: WFL for tasks assessed/performed Overall Cognitive Status: Within Functional Limits for tasks assessed                                        Exercises Total Joint Exercises Ankle Circles/Pumps: AROM;10 reps Quad Sets: Strengthening;10 reps Short Arc Quad: AROM;Strengthening;10 reps Heel Slides: AAROM;10 reps(with resisted leg extensions) Hip ABduction/ADduction: Strengthening;10 reps Straight Leg Raises: AROM;10 reps Knee Flexion: PROM;10 reps Goniometric ROM: 2-78 Other Exercises Other Exercises: Pt instructed in cognitive behavioral pain coping strategies, compression stocking mgt, polar care mgt, falls prevention strategies, and pet care considerations    General Comments        Pertinent Vitals/Pain Pain Assessment: 0-10 Pain Score: 6  Pain Location: R knee/thigh Pain Descriptors / Indicators: Aching Pain Intervention(s): Limited activity within patient's tolerance;Monitored during session;Repositioned;Ice applied    Home Living Family/patient expects to be discharged to:: Private residence Living Arrangements: Alone Available Help at Discharge: Friend(s);Family(family  staying with her 24/7 for first 12 days) Type of Home: House Home Access: Stairs to enter Entrance Stairs-Rails: Right;Left(wide) Home Layout: One level Home Equipment: Environmental consultant - 2 wheels;Cane - single point;Tub bench;Bedside commode      Prior Function        Comments: Pt was able to be independent and active, has had chronic R knee issues that have been limiting   PT Goals (current goals can now be found in the care plan section) Acute Rehab PT  Goals Patient Stated Goal: return to PLOF with less pain so she can go to beach with family in September PT Goal Formulation: With patient Time For Goal Achievement: 07/07/18 Potential to Achieve Goals: Fair Progress towards PT goals: Progressing toward goals    Frequency    BID      PT Plan Current plan remains appropriate    Co-evaluation              AM-PAC PT "6 Clicks" Mobility   Outcome Measure  Help needed turning from your back to your side while in a flat bed without using bedrails?: None Help needed moving from lying on your back to sitting on the side of a flat bed without using bedrails?: A Little Help needed moving to and from a bed to a chair (including a wheelchair)?: A Little Help needed standing up from a chair using your arms (e.g., wheelchair or bedside chair)?: A Little Help needed to walk in hospital room?: A Little Help needed climbing 3-5 steps with a railing? : A Lot 6 Click Score: 18    End of Session Equipment Utilized During Treatment: Gait belt Activity Tolerance: Patient limited by pain;Patient tolerated treatment well Patient left: with call bell/phone within reach;with chair alarm set;with nursing/sitter in room Nurse Communication: Mobility status PT Visit Diagnosis: Unsteadiness on feet (R26.81);Muscle weakness (generalized) (M62.81);Difficulty in walking, not elsewhere classified (R26.2);Pain Pain - Right/Left: Right Pain - part of body: Knee     Time: 7654-6503 PT Time Calculation (min) (ACUTE ONLY): 28 min  Charges:  $Gait Training: 8-22 mins $Therapeutic Exercise: 8-22 mins                     Kreg Shropshire, DPT 06/23/2018, 3:13 PM

## 2018-06-23 NOTE — TOC Benefit Eligibility Note (Signed)
Transition of Care [facility]) Benefit Eligibility Note    Patient Details  Name: Belinda Norman MRN: [id number] Date of Birth: 07/17/1960   Medication/Dose: Enoxaparin 40 mg daily x 14 days  Covered?: (Generic - Enoxaparin)  Tier: Other(Not listed)  Prescription Coverage Preferred Pharmacy: Preferred Pharmacy: [facility]  Spoke with Person/Company/Phone Number:: Mickel Baas [employer], [phone]  Co-Pay: $50  Prior Approval: No(Generic)     Additional Notes: Lovenox - Exclusion from plan and would need prior approval [place] Phone Number: 06/23/2018, 8:14 AM

## 2018-06-23 NOTE — Progress Notes (Signed)
  Subjective: 1 Day Post-Op Procedure(s) (LRB): COMPUTER ASSISTED TOTAL KNEE ARTHROPLASTY RIGHT - Will need an RNFA (Right) Patient reports pain as moderate.   Patient seen in rounds with Dr. Marry Guan. Patient is well, and has had no acute complaints or problems Plan is to go Home after hospital stay. Negative for chest pain and shortness of breath Fever: no Gastrointestinal: Negative for nausea and vomiting  Objective: Vital signs in last 24 hours: Temp:  [97.4 F (36.3 C)-98.8 F (37.1 C)] 98.1 F (36.7 C) (06/11 0344) Pulse Rate:  [68-93] 68 (06/11 0344) Resp:  [10-20] 18 (06/11 0344) BP: (93-121)/(55-82) 95/66 (06/11 0344) SpO2:  [95 %-100 %] 100 % (06/11 0344) Weight:  [71.9 kg] 71.9 kg (06/10 1013)  Intake/Output from previous day:  Intake/Output Summary (Last 24 hours) at 06/23/2018 0654 Last data filed at 06/23/2018 0401 Gross per 24 hour  Intake 1600 ml  Output 1595 ml  Net 5 ml    Intake/Output this shift: Total I/O In: -  Out: 700 [Urine:600; Drains:100]  Labs: No results for input(s): HGB in the last 72 hours. No results for input(s): WBC, RBC, HCT, PLT in the last 72 hours. No results for input(s): NA, K, CL, CO2, BUN, CREATININE, GLUCOSE, CALCIUM in the last 72 hours. No results for input(s): LABPT, INR in the last 72 hours.   EXAM General - Patient is Alert and Oriented Extremity - Neurovascular intact Sensation intact distally Dorsiflexion/Plantar flexion intact Dressing/Incision - clean, dry, with a Hemovac intact Motor Function - intact, moving foot and toes well on exam.  Able to straight leg raise independently  Past Medical History:  Diagnosis Date  . Anxiety   . Arthritis   . Cancer [facility])    breast cancer. had bilateral mastectomies  . DCIS (ductal carcinoma in situ)   . Depression   . GERD (gastroesophageal reflux disease)   . Hepatitis C    cured in 2014  . HSV (herpes simplex virus) infection   . IBS (irritable bowel syndrome)      Assessment/Plan: 1 Day Post-Op Procedure(s) (LRB): COMPUTER ASSISTED TOTAL KNEE ARTHROPLASTY RIGHT - Will need an RNFA (Right) Active Problems:   Total knee replacement status  Estimated body mass index is 25.2 kg/m as calculated from the following:   Height as of this encounter: 5' 6.5" (1.689 m).   Weight as of this encounter: 71.9 kg. Advance diet Up with therapy D/C IV fluids Discharge home with home health tomorrow.  DVT Prophylaxis - Lovenox, Foot Pumps and TED hose Weight-Bearing as tolerated to right leg  Reche Dixon, PA-C Orthopaedic Surgery 06/23/2018, 6:54 AM

## 2018-06-23 NOTE — Evaluation (Signed)
Occupational Therapy Evaluation Patient Details Name: Belinda Norman MRN: [id number] DOB: September 10, 1960 Today's Date: 06/23/2018    History of Present Illness 58 y/o female s/p R total knee replacement 6/10.  H/o of multiple R knee scopes and interventions with chronic pain issues.   Clinical Impression   Pt seen for OT evaluation this date, POD#1 from above surgery. Pt was independent in all ADLs prior to surgery, however limited by chronic pain and R knee pain. Pt is eager to return to PLOF with less pain and improved safety and independence, particularly so she can go to the beach with her family in September. Pt currently requires PRN minimal assist for LB dressing and bathing while in seated position due to pain and slightly limited R knee AROM. Pt instructed in polar care mgt, falls prevention strategies including pet care considerations, cognitive behavioral pain coping skills, home/routines modifications, DME/AE for LB bathing and dressing tasks, and compression stocking mgt. Pt verbalized understanding and would benefit from skilled OT services including additional instruction in dressing techniques with or without assistive devices for dressing and bathing skills to support recall and carryover prior to discharge and ultimately to maximize safety, independence, and minimize falls risk and caregiver burden. Do not currently anticipate any OT needs following this hospitalization.      Follow Up Recommendations  No OT follow up    Equipment Recommendations  Other (comment)(reacher)    Recommendations for Other Services       Precautions / Restrictions Precautions Precautions: Fall;Knee Restrictions Weight Bearing Restrictions: Yes RLE Weight Bearing: Weight bearing as tolerated      Mobility Bed Mobility Overal bed mobility: Needs Assistance Bed Mobility: Supine to Sit;Sit to Supine     Supine to sit: Min guard Sit to supine: Min guard   General bed mobility comments: Pt  guarded and hesitant with getting LEs to EOB, did need night assist to attain sitting EOB   Transfers Overall transfer level: Needs assistance Equipment used: Rolling walker (2 wheeled) Transfers: Sit to/from Stand Sit to Stand: Min assist         General transfer comment: Pt showed good effort, needed cues for set up and appropriate R LE use and set up    Balance Overall balance assessment: Modified Independent                                         ADL either performed or assessed with clinical judgement   ADL Overall ADL's : Needs assistance/impaired                                       General ADL Comments: PRN Min A for LB ADL tasks     Vision Baseline Vision/History: Wears glasses Wears Glasses: At all times Patient Visual Report: No change from baseline       Perception     Praxis      Pertinent Vitals/Pain Pain Assessment: 0-10 Pain Score: 3  Pain Location: R knee and low back Pain Descriptors / Indicators: Aching Pain Intervention(s): Limited activity within patient's tolerance;Monitored during session;Repositioned;Ice applied     Hand Dominance Right   Extremity/Trunk Assessment Upper Extremity Assessment Upper Extremity Assessment: Overall WFL for tasks assessed   Lower Extremity Assessment Lower Extremity Assessment: (expected post-op strength/ROM deficits)  Communication Communication Communication: No difficulties   Cognition Arousal/Alertness: Awake/alert Behavior During Therapy: WFL for tasks assessed/performed Overall Cognitive Status: Within Functional Limits for tasks assessed                                     General Comments       Exercises Other Exercises Other Exercises: Pt instructed in cognitive behavioral pain coping strategies, compression stocking mgt, polar care mgt, falls prevention strategies, and pet care considerations   Shoulder Instructions      Home  Living Family/patient expects to be discharged to:: Private residence Living Arrangements: Alone Available Help at Discharge: Friend(s);Family(family staying with her 24/7 for first 12 days) Type of Home: House Home Access: Stairs to enter [employer] of Steps: 4 Entrance Stairs-Rails: Right;Left(wide) Home Layout: One level     Bathroom Shower/Tub: Teacher, early years/pre: Standard     Home Equipment: Environmental consultant - 2 wheels;Cane - single point;Tub bench;Bedside commode          Prior Functioning/Environment          Comments: Pt was able to be independent and active, has had chronic R knee issues that have been limiting        OT Problem List: Decreased strength;Decreased range of motion;Pain;Impaired balance (sitting and/or standing);Decreased knowledge of use of DME or AE      OT Treatment/Interventions: Self-care/ADL training;Therapeutic activities;Therapeutic exercise;DME and/or AE instruction;Patient/family education;Balance training    OT Goals(Current goals can be found in the care plan section) Acute Rehab OT Goals Patient Stated Goal: return to PLOF with less pain so she can go to beach with family in September OT Goal Formulation: With patient Time For Goal Achievement: 07/07/18 Potential to Achieve Goals: Good ADL Goals Pt Will Perform Lower Body Dressing: with supervision;sit to/from stand;with adaptive equipment Pt Will Transfer to Toilet: with supervision;ambulating(BSC over toilet, LRAD for amb) Additional ADL Goal #1: Pt will independently instruct family in compression stocking mgt, including positioning, donning/doffing, and wear schedule. Additional ADL Goal #2: Pt will independently instruct family in polar care mgt, including positioning, donning/doffing, and wear schedule. Additional ADL Goal #3: Pt will verbalize plan to implement at least 1 learned cognitive/behavioral pain coping strategy to maximize safety/indep with ADL  OT  Frequency: Min 1X/week   Barriers to D/C:            Co-evaluation              AM-PAC OT "6 Clicks" Daily Activity     Outcome Measure Help from another person eating meals?: None Help from another person taking care of personal grooming?: None Help from another person toileting, which includes using toliet, bedpan, or urinal?: A Little Help from another person bathing (including washing, rinsing, drying)?: A Little Help from another person to put on and taking off regular upper body clothing?: None Help from another person to put on and taking off regular lower body clothing?: A Little 6 Click Score: 21   End of Session    Activity Tolerance: Patient tolerated treatment well Patient left: in bed;with call bell/phone within reach;with bed alarm set;with SCD's reapplied;Other (comment)(polar care in place)  OT Visit Diagnosis: Muscle weakness (generalized) (M62.81);Pain Pain - Right/Left: Right Pain - part of body: Knee                Time: 1326-1350 OT Time Calculation (min): 24 min Charges:  OT General Charges $OT Visit: 1 Visit OT Evaluation $OT Eval Low Complexity: 1 Low OT Treatments $Self Care/Home Management : 8-22 mins  Jeni Salles, MPH, MS, OTR/L ascom [phone] 06/23/18, 2:10 PM

## 2018-06-23 NOTE — TOC Initial Note (Signed)
Transition of Care [facility]) - Initial/Assessment Note    Patient Details  Name: Belinda Norman MRN: [id number] Date of Birth: 17-Dec-1960  Transition of [facility]) CM/SW Contact:    Belinda Hilt, RN Phone Number: 06/23/2018, 3:46 PM  Clinical Narrative:                 Met with the patient to discuss DC Plan and needs, She lives alone but her sister Belinda Norman will be staying with her and providing transportation  She was set up prior to surgery by Surgeon's office with [facility], Belinda Norman has been notified and accepted the patient Lovenox price given at 50$ and patient states that she can afford it. She has all of the DME she needs at home including a RW, BSC, Shower seat, crutches. No other DME needs noted She has a PCP in place with Belinda Norman and is up to date Pharmacy is [facility] She states that she has no other needs at this time   Expected Discharge Plan: [place] Barriers to Discharge: Continued Medical Work up   Patient Goals and CMS Choice Patient states their goals for this hospitalization and ongoing recovery are:: go home CMS Medicare.gov Compare Post Acute Care list provided to:: Patient Choice offered to / list presented to : Patient  Expected Discharge Plan and Services Expected Discharge Plan: [place]   Discharge Planning Services: CM Consult Post Acute Care Choice: [place] arrangements for the past 2 months: Single Family Home Expected Discharge Date: 06/24/18               DME Arranged: N/A         HH Arranged: PT HH Agency: NA Date [place]: 06/23/18 Time [place]: 2620 Representative spoke with at [place]: Belinda Norman  Prior Living Arrangements/Services Living arrangements for the past 2 months: [place] with:: Self Patient language and need for interpreter reviewed:: No Do you feel safe going back to the place where you live?: Yes      Need for Family  Participation in Patient Care: No (Comment) Care giver support system in place?: Yes (comment) Current home services: DME(BSC, RW, Shower chair, crutches) Criminal Activity/Legal Involvement Pertinent to Current Situation/Hospitalization: No - Comment as needed  Activities of Daily Living Home Assistive Devices/Equipment: Environmental consultant (specify type) ADL Screening (condition at time of admission) Patient's cognitive ability adequate to safely complete daily activities?: Yes Is the patient deaf or have difficulty hearing?: No Does the patient have difficulty seeing, even when wearing glasses/contacts?: No Does the patient have difficulty concentrating, remembering, or making decisions?: No Patient able to express need for assistance with ADLs?: No Does the patient have difficulty dressing or bathing?: No Independently performs ADLs?: No Does the patient have difficulty walking or climbing stairs?: No Weakness of Legs: Both Weakness of Arms/Hands: None  Permission Sought/Granted Permission sought to share information with : Case Manager Permission granted to share information with : Yes, Verbal Permission Granted     Permission granted to share info w [facility]        Emotional Assessment Appearance:: Appears stated age Attitude/Demeanor/Rapport: Engaged Affect (typically observed): Accepting Orientation: : Oriented to Self, Oriented to Place, Oriented to  Time, Oriented to Situation Alcohol / Substance Use: Not Applicable Psych Involvement: No (comment)  Admission diagnosis:  PRIMARY OSTEOARTHRITIS OF RIGHT KNEE Patient Active Problem List   Diagnosis Date Noted  . Total knee replacement status  06/22/2018  . Dyspepsia 06/07/2017  . Mastalgia 08/14/2016  . History of mastectomy, right 08/10/2016  . Primary osteoarthritis of right knee 03/24/2016  . Muscle spasm 11/12/2015  . Breast cancer ([place]) 09/27/2013  . Osteoporosis 08/30/2013  . 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 SI (sacroiliac) pain 12/21/2012  . Lumbosacral facet joint syndrome 12/21/2012  . Back pain 12/18/2012  . Hemorrhoids 12/18/2012  . Thrombocytopenia ([place]) 12/18/2012  . GERD (gastroesophageal reflux disease) 12/05/2011  . Depression 12/05/2011  . Hepatitis C 11/18/2011  . Disorder of sacrum 09/15/2011   PCP:  Belinda Dress, MD Pharmacy:   [id number] [place] [place] [place] [address]Phone: [phone]: 9[id number]: [phone]: [phone]    Social Determinants of Health (SDOH) Interventions    Readmission Risk Interventions No flowsheet data found.

## 2018-06-23 NOTE — Evaluation (Signed)
Physical Therapy Evaluation Patient Details Name: Belinda Norman MRN: [id number] DOB: [birth year] Today's Date: 06/23/2018   History of Present Illness  58 y/o female s/p R total knee replacement 6/10.  H/o of multiple R knee scopes and interventions with chronic pain issues.  Clinical Impression  Per typical post-op protocol metrics pt is doing relatively well, however despite getting pain meds pt was very guarded and pain limited.  Apparently she has been taking stronger pain meds chronically and was very uncomfortable t/o initiation of PT post-op this AM.  Pt needed some extra time and cuing to perform some acts, however she ultimately showed good effort despite pain and remained motivated.  Will attempt to coordinate PT sessions with pain meds as much as possible t/o her time in hosptial.    Follow Up Recommendations Home health PT;Supervision/Assistance - 24 hour    Equipment Recommendations  None recommended by PT    Recommendations for Other Services       Precautions / Restrictions Precautions Precautions: Fall;Knee Restrictions Weight Bearing Restrictions: Yes RLE Weight Bearing: Weight bearing as tolerated      Mobility  Bed Mobility Overal bed mobility: Needs Assistance Bed Mobility: Supine to Sit     Supine to sit: Min assist     General bed mobility comments: Pt guarded and hesitant with getting LEs to EOB, did need night assist to attain sitting EOB   Transfers Overall transfer level: Needs assistance Equipment used: Rolling walker (2 wheeled) Transfers: Sit to/from Stand Sit to Stand: Min assist         General transfer comment: Pt showed good effort, needed cues for set up and appropriate R LE use and set up  Ambulation/Gait Ambulation/Gait assistance: Min assist Gait Distance (Feet): 45 Feet Assistive device: Rolling walker (2 wheeled)       General Gait Details: Pt was very motivated to do as much as she could with ambulation however she was  very slow and guarded and clearly in considerable pain t/o the effort with heavy reliance on the walker and very little knee mobility.  Stairs            Wheelchair Mobility    Modified Rankin (Stroke Patients Only)       Balance Overall balance assessment: Modified Independent                                           Pertinent Vitals/Pain Pain Assessment: 0-10 Pain Score: 7  Pain Location: incision site and medial joint    Home Living Family/patient expects to be discharged to:: Private residence Living Arrangements: Alone Available Help at Discharge: Friend(s);Family(family staying with her 24/7 for first 12 days) Type of Home: House Home Access: Stairs to enter Entrance Stairs-Rails: Right;Left(wide) Entrance Stairs-Number of Steps: 4   Home Equipment: Walker - 2 wheels;Cane - single point;Tub bench;Bedside commode      Prior Function           Comments: Pt was able to be independent and active, has had chronic R knee issues that have been limiting     Hand Dominance        Extremity/Trunk Assessment   Upper Extremity Assessment Upper Extremity Assessment: Overall WFL for tasks assessed    Lower Extremity Assessment Lower Extremity Assessment: Overall WFL for tasks assessed(expected post-op pain, hesitant 2/2 pain)       Communication  Cognition Arousal/Alertness: Awake/alert Behavior During Therapy: WFL for tasks assessed/performed Overall Cognitive Status: Within Functional Limits for tasks assessed                                        General Comments      Exercises Total Joint Exercises Ankle Circles/Pumps: AROM;10 reps Quad Sets: Strengthening;10 reps Short Arc Quad: AROM;Strengthening;10 reps Heel Slides: AAROM;5 reps Hip ABduction/ADduction: Strengthening;10 reps Straight Leg Raises: AROM;AAROM Knee Flexion: PROM;5 reps Goniometric ROM: 2-78   Assessment/Plan    PT Assessment  Patient needs continued PT services  PT Problem List Decreased balance;Decreased coordination;Decreased strength;Decreased range of motion;Decreased activity tolerance;Decreased mobility;Decreased cognition;Pain;Decreased safety awareness;Decreased knowledge of use of DME       PT Treatment Interventions DME instruction;Gait training;Stair training;Functional mobility training;Therapeutic exercise;Therapeutic activities;Balance training;Neuromuscular re-education;Patient/family education    PT Goals (Current goals can be found in the Care Plan section)  Acute Rehab PT Goals Patient Stated Goal: control the pain PT Goal Formulation: With patient Time For Goal Achievement: 07/07/18 Potential to Achieve Goals: Fair    Frequency BID   Barriers to discharge        Co-evaluation               AM-PAC PT "6 Clicks" Mobility  Outcome Measure Help needed turning from your back to your side while in a flat bed without using bedrails?: None Help needed moving from lying on your back to sitting on the side of a flat bed without using bedrails?: A Little Help needed moving to and from a bed to a chair (including a wheelchair)?: A Little Help needed standing up from a chair using your arms (e.g., wheelchair or bedside chair)?: A Little Help needed to walk in hospital room?: A Lot Help needed climbing 3-5 steps with a railing? : A Lot 6 Click Score: 17    End of Session Equipment Utilized During Treatment: Gait belt Activity Tolerance: Patient limited by pain;Patient tolerated treatment well Patient left: with call bell/phone within reach;with chair alarm set;with nursing/sitter in room Nurse Communication: Mobility status PT Visit Diagnosis: Unsteadiness on feet (R26.81)    Time: 0881-1031 PT Time Calculation (min) (ACUTE ONLY): 32 min   Charges:   PT Evaluation $PT Eval Low Complexity: 1 Low PT Treatments $Gait Training: 8-22 mins $Therapeutic Exercise: 8-22 mins         Kreg Shropshire, DPT 06/23/2018, 1:19 PM

## 2018-06-24 LAB — CREATININE, SERUM
Creatinine, Ser: 0.8 mg/dL (ref 0.44–1.00)
GFR calc Af Amer: >60 mL/min (ref >60)
GFR calc non Af Amer: >60 mL/min (ref >60)

## 2018-06-24 MED ORDER — ENOXAPARIN SODIUM 40 MG/0.4ML ~~LOC~~ SOLN: 40.0000 mg | SUBCUTANEOUS | 0 refills | Status: DC

## 2018-06-24 MED ORDER — CELECOXIB 200 MG PO CAPS: 200.0000 mg | ORAL_CAPSULE | Freq: Two times a day (BID) | ORAL | 1 refills | Status: DC

## 2018-06-24 MED ORDER — OXYCODONE HCL 5 MG PO TABS: 5.0000 mg | ORAL_TABLET | ORAL | 0 refills | Status: DC | PRN

## 2018-06-24 NOTE — Progress Notes (Signed)
  Subjective: 2 Days Post-Op Procedure(s) (LRB): COMPUTER ASSISTED TOTAL KNEE ARTHROPLASTY RIGHT - Will need an RNFA (Right) Patient reports pain as mild to moderate.   Patient seen in rounds with Dr. Marry Guan. Patient is well, and has had no acute complaints or problems Plan is to go Home after hospital stay. Negative for chest pain and shortness of breath Fever: no Gastrointestinal: Negative for nausea and vomiting  Objective: Vital signs in last 24 hours: Temp:  [97.6 F (36.4 C)-98.1 F (36.7 C)] 97.6 F (36.4 C) (06/12 0125) Pulse Rate:  [67-76] 74 (06/12 0125) Resp:  [16-18] 16 (06/12 0125) BP: (83-96)/(50-65) 92/57 (06/12 0125) SpO2:  [95 %-100 %] 100 % (06/12 0125)  Intake/Output from previous day:  Intake/Output Summary (Last 24 hours) at 06/24/2018 0654 Last data filed at 06/24/2018 0431 Gross per 24 hour  Intake 1231.36 ml  Output 30 ml  Net 1201.36 ml    Intake/Output this shift: Total I/O In: 110 [Other:110] Out: -   Labs: No results for input(s): HGB in the last 72 hours. No results for input(s): WBC, RBC, HCT, PLT in the last 72 hours. No results for input(s): NA, K, CL, CO2, BUN, CREATININE, GLUCOSE, CALCIUM in the last 72 hours. No results for input(s): LABPT, INR in the last 72 hours.   EXAM General - Patient is Alert and Oriented Extremity - Neurovascular intact Sensation intact distally Dorsiflexion/Plantar flexion intact Dressing/Incision - clean, dry, with the Hemovac removed with no complication.  The Hemovac tubing appeared to be intact on removal Motor Function - intact, moving foot and toes well on exam.  Able to straight leg raise independently  Past Medical History:  Diagnosis Date  . Anxiety   . Arthritis   . Cancer [facility])    breast cancer. had bilateral mastectomies  . DCIS (ductal carcinoma in situ)   . Depression   . GERD (gastroesophageal reflux disease)   . Hepatitis C    cured in 2014  . HSV (herpes simplex virus) infection    . IBS (irritable bowel syndrome)     Assessment/Plan: 2 Days Post-Op Procedure(s) (LRB): COMPUTER ASSISTED TOTAL KNEE ARTHROPLASTY RIGHT - Will need an RNFA (Right) Active Problems:   Total knee replacement status  Estimated body mass index is 25.2 kg/m as calculated from the following:   Height as of this encounter: 5' 6.5" (1.689 m).   Weight as of this encounter: 71.9 kg. Advance diet Up with therapy D/C IV fluids Discharge home with home health today.  DVT Prophylaxis - Lovenox, Foot Pumps and TED hose Weight-Bearing as tolerated to right leg  Reche Dixon, PA-C Orthopaedic Surgery 06/24/2018, 6:54 AM

## 2018-06-24 NOTE — Discharge Summary (Signed)
Physician Discharge Summary  Subjective: 2 Days Post-Op Procedure(s) (LRB): COMPUTER ASSISTED TOTAL KNEE ARTHROPLASTY RIGHT - Will need an RNFA (Right) Patient reports pain as mild.   Patient seen in rounds with Dr. Marry Guan. Patient is well, and has had no acute complaints or problems Patient is ready to go home with home health physical therapy  Physician Discharge Summary  Patient ID: Belinda Norman MRN: [id number] DOB/AGE: 1960-04-28 58 y.o.  Admit date: 06/22/2018 Discharge date: 06/24/2018  Admission Diagnoses:  Discharge Diagnoses:  Active Problems:   Total knee replacement status   Discharged Condition: fair  Hospital Course: The patient is postop day 2 from a right total knee replacement.  She is doing well since surgery.  Her vitals have remained stable.  She has not had a bowel movement yet.  The patient had her dressing changed and drain removed today.  She has ambulated 75 feet with physical therapy.  Treatments: surgery:    Right total knee arthroplasty using computer-assisted navigation  SURGEON:  Marciano Sequin. M.D.  ANESTHESIA: general  ESTIMATED BLOOD LOSS: 50 mL  FLUIDS REPLACED: 1000 mL of crystalloid  TOURNIQUET TIME: 94 minutes  DRAINS: 2 medium Hemovac drains  SOFT TISSUE RELEASES: Anterior cruciate ligament, posterior cruciate ligament, deep medial collateral ligament, patellofemoral ligament  IMPLANTS UTILIZED: DePuy Attune size 4 posterior stabilized femoral component (cemented), size 4 rotating platform tibial component (cemented), 35 mm medialized dome patella (cemented), and a 5 mm stabilized rotating platform polyethylene insert.   Discharge Exam: Blood pressure (!) 92/57, pulse 74, temperature 97.6 F (36.4 C), temperature source Oral, resp. rate 16, height 5' 6.5" (1.689 m), weight 71.9 kg, SpO2 100 %.   Disposition: Discharge disposition: 01-Home or Self Care        Allergies as of 06/24/2018      Reactions   Percocet [oxycodone-acetaminophen] Nausea And Vomiting   Severe n & v. Could only take Dilaudid   Zofran [ondansetron Hcl] Other (See Comments)   headaches   Codeine Nausea And Vomiting, Nausea Only   headache   Epinephrine Other (See Comments)   Causes "craziness" with dental cases only   Morphine And Related Other (See Comments)   During breast surgeries, did not work for pain      Medication List    TAKE these medications   acetaminophen 500 MG tablet Commonly known as: TYLENOL Take 500 mg by mouth every 6 (six) hours as needed for moderate pain.   acyclovir 400 MG tablet Commonly known as: ZOVIRAX Take 400 mg by mouth 5 (five) times daily as needed.   albuterol 108 (90 Base) MCG/ACT inhaler Commonly known as: VENTOLIN HFA Inhale 2 puffs into the lungs every 6 (six) hours as needed for wheezing.   ALIGN PREBIOTIC-PROBIOTIC PO Take 1 capsule by mouth daily.   azelastine 0.1 % nasal spray Commonly known as: ASTELIN Place 2 sprays into the nose 2 (two) times daily.   celecoxib 200 MG capsule Commonly known as: CELEBREX Take 1 capsule (200 mg total) by mouth 2 (two) times daily.   citalopram 10 MG tablet Commonly known as: CELEXA Take 10 mg by mouth daily.   clonazePAM 1 MG tablet Commonly known as: KLONOPIN Take 1 mg by mouth 2 (two) times daily as needed for anxiety.   DULoxetine 30 MG capsule Commonly known as: CYMBALTA Take 90 mg by mouth daily.   enoxaparin 40 MG/0.4ML injection Commonly known as: LOVENOX Inject 0.4 mLs (40 mg total) into the skin daily.  fexofenadine 180 MG tablet Commonly known as: ALLEGRA Take 180 mg by mouth daily.   fluticasone 50 MCG/ACT nasal spray Commonly known as: FLONASE Place 2 sprays into both nostrils daily as needed for allergies or rhinitis.   hyoscyamine 0.125 MG Tbdp disintergrating tablet Commonly known as: ANASPAZ Place 0.125 mg under the tongue every 4 (four) hours as needed.   oxyCODONE 5 MG immediate  release tablet Commonly known as: Oxy IR/ROXICODONE Take 1-2 tablets (5-10 mg total) by mouth every 4 (four) hours as needed for moderate pain.   Vitamin D3 25 MCG (1000 UT) Caps Take 2,000 Units by mouth daily.            Durable Medical Equipment  (From admission, onward)         Start     Ordered   06/22/18 1825  DME Walker rolling  Once    Question:  Patient needs a walker to treat with the following condition  Answer:  Total knee replacement status   06/22/18 1825   06/22/18 1825  DME Bedside commode  Once    Question:  Patient needs a bedside commode to treat with the following condition  Answer:  Total knee replacement status   06/22/18 1825         Follow-up Information    Watt Climes, PA On 07/05/2018.   Specialty: Physician Assistant Why: at 10:15am Contact information: [place] [address] [phone]        Dereck Leep, MD On 08/04/2018.   Specialty: Orthopedic Surgery Why: at 11:15am Contact information: [address] [facility] [place] [address] [phone]           Signed: Prescott Parma, Crecencio Kwiatek 06/24/2018, 7:00 AM   Objective: Vital signs in last 24 hours: Temp:  [97.6 F (36.4 C)-98.1 F (36.7 C)] 97.6 F (36.4 C) (06/12 0125) Pulse Rate:  [67-76] 74 (06/12 0125) Resp:  [16-18] 16 (06/12 0125) BP: (83-96)/(50-65) 92/57 (06/12 0125) SpO2:  [95 %-100 %] 100 % (06/12 0125)  Intake/Output from previous day:  Intake/Output Summary (Last 24 hours) at 06/24/2018 0700 Last data filed at 06/24/2018 0431 Gross per 24 hour  Intake 1231.36 ml  Output 30 ml  Net 1201.36 ml    Intake/Output this shift: Total I/O In: 110 [Other:110] Out: -   Labs: No results for input(s): HGB in the last 72 hours. No results for input(s): WBC, RBC, HCT, PLT in the last 72 hours. No results for input(s): NA, K, CL, CO2, BUN, CREATININE, GLUCOSE, CALCIUM in the last 72 hours. No results for input(s): LABPT, INR  in the last 72 hours.  EXAM: General - Patient is Alert and Oriented Extremity - Neurovascular intact Sensation intact distally Dorsiflexion/Plantar flexion intact Compartment soft Incision - clean, dry, with the Hemovac removed. Motor Function -plantarflexion and dorsiflexion are intact.  Able to straight leg raise independently.  Assessment/Plan: 2 Days Post-Op Procedure(s) (LRB): COMPUTER ASSISTED TOTAL KNEE ARTHROPLASTY RIGHT - Will need an RNFA (Right) Procedure(s) (LRB): COMPUTER ASSISTED TOTAL KNEE ARTHROPLASTY RIGHT - Will need an RNFA (Right) Past Medical History:  Diagnosis Date  . Anxiety   . Arthritis   . Cancer [facility])    breast cancer. had bilateral mastectomies  . DCIS (ductal carcinoma in situ)   . Depression   . GERD (gastroesophageal reflux disease)   . Hepatitis C    cured in 2014  . HSV (herpes simplex virus) infection   . IBS (irritable bowel syndrome)  Active Problems:   Total knee replacement status  Estimated body mass index is 25.2 kg/m as calculated from the following:   Height as of this encounter: 5' 6.5" (1.689 m).   Weight as of this encounter: 71.9 kg. Advance diet Up with therapy D/C IV fluids Discharge home with home health Diet - Regular diet Follow up - in 2 weeks Activity - WBAT Disposition - Home Condition Upon Discharge - Stable DVT Prophylaxis - Lovenox and TED hose  Reche Dixon, PA-C Orthopaedic Surgery 06/24/2018, 7:00 AM

## 2018-06-24 NOTE — Progress Notes (Signed)
Discharge instructions and prescriptions reviewed with patient. Verbalization of understanding given. Surgical leg washed off and dressing changed. Patient given two dressings to take home.

## 2018-06-24 NOTE — Plan of Care (Signed)
Patient progressing to met goals for discharge. Patient administered her own dose of Lovenox with the supervision of this Probation officer

## 2018-06-24 NOTE — Progress Notes (Signed)
Physical Therapy Treatment Patient Details Name: Belinda Norman MRN: [id number] DOB: [birth year] Today's Date: 06/24/2018    History of Present Illness 58 y/o female s/p R total knee replacement 6/10.  H/o of multiple R knee scopes and interventions with chronic pain issues.    PT Comments    Pt presents with deficits in strength, transfers, R knee ROM, gait, balance, and activity tolerance but is progressing well towards goals.  Pt was Mod Ind with bed mobility tasks and CGA/SBA with transfers.  Pt was able to amb 150' with a RW and step-through pattern with good stability.  Pt was able to ascend and descend 4 steps with one rail with good eccentric and concentric control.  Pt will benefit from HHPT services upon discharge to safely address above deficits for decreased caregiver assistance and eventual return to PLOF.     Follow Up Recommendations  Home health PT;Supervision/Assistance - 24 hour     Equipment Recommendations  None recommended by PT    Recommendations for Other Services       Precautions / Restrictions Precautions Precautions: Fall;Knee Restrictions Weight Bearing Restrictions: Yes RLE Weight Bearing: Weight bearing as tolerated    Mobility  Bed Mobility Overal bed mobility: Modified Independent             General bed mobility comments: Extra time and effort required but no physical assistance during sup to/from sit  Transfers Overall transfer level: Needs assistance Equipment used: Rolling walker (2 wheeled) Transfers: Sit to/from Stand Sit to Stand: Min guard;Supervision         General transfer comment: Min verbal cues for sequencing  Ambulation/Gait Ambulation/Gait assistance: Min Gaffer (Feet): 150 Feet Assistive device: Rolling walker (2 wheeled) Gait Pattern/deviations: Step-to pattern;Step-through pattern;Decreased stance time - right Gait velocity: decreased   General Gait Details: Step-to gait pattern  that progressed to step-through pattern during the session   Stairs Stairs: Yes Stairs assistance: Min guard Stair Management: One rail Right Number of Stairs: 4 General stair comments: Good control and stability ascending and descending stairs with min verbal cues for sequencing   Wheelchair Mobility    Modified Rankin (Stroke Patients Only)       Balance Overall balance assessment: Mild deficits observed, not formally tested                                          Cognition Arousal/Alertness: Awake/alert Behavior During Therapy: WFL for tasks assessed/performed Overall Cognitive Status: Within Functional Limits for tasks assessed                                        Exercises Total Joint Exercises Ankle Circles/Pumps: AROM;10 reps;Both Quad Sets: Strengthening;10 reps;AROM;Both;15 reps Hip ABduction/ADduction: 10 reps;Both;AROM Straight Leg Raises: AROM;10 reps;Both Long Arc Quad: AROM;Strengthening;Both;10 reps;15 reps Knee Flexion: AROM;Strengthening;Both;10 reps;15 reps Goniometric ROM: R knee AROM: 4-84 deg Other Exercises Other Exercises: HEP education and review Other Exercises: Positioning education Other Exercises: Car transfers sequencing verbal/visual education    General Comments        Pertinent Vitals/Pain Pain Assessment: 0-10 Pain Score: 4  Pain Location: R knee Pain Descriptors / Indicators: Aching;Sore Pain Intervention(s): Monitored during session;Premedicated before session    Home Living  Prior Function            PT Goals (current goals can now be found in the care plan section) Progress towards PT goals: Progressing toward goals    Frequency    BID      PT Plan Current plan remains appropriate    Co-evaluation              AM-PAC PT "6 Clicks" Mobility   Outcome Measure  Help needed turning from your back to your side while in a flat bed without  using bedrails?: None Help needed moving from lying on your back to sitting on the side of a flat bed without using bedrails?: None Help needed moving to and from a bed to a chair (including a wheelchair)?: None Help needed standing up from a chair using your arms (e.g., wheelchair or bedside chair)?: None Help needed to walk in hospital room?: A Little Help needed climbing 3-5 steps with a railing? : A Little 6 Click Score: 22    End of Session Equipment Utilized During Treatment: Gait belt Activity Tolerance: Patient tolerated treatment well Patient left: with call bell/phone within reach;with nursing/sitter in room;in bed;with bed alarm set;with SCD's reapplied;Other (comment)(Polar care donned to R knee) Nurse Communication: Mobility status PT Visit Diagnosis: Unsteadiness on feet (R26.81);Muscle weakness (generalized) (M62.81);Difficulty in walking, not elsewhere classified (R26.2);Pain Pain - Right/Left: Right     Time: 3007-6226 PT Time Calculation (min) (ACUTE ONLY): 40 min  Charges:  $Gait Training: 8-22 mins $Therapeutic Exercise: 8-22 mins $Therapeutic Activity: 8-22 mins                     D. Scott Jay Haskew PT, DPT 06/24/18, 1:15 PM

## 2018-06-24 NOTE — TOC Transition Note (Signed)
Transition of Care [facility]) - CM/SW Discharge Note   Patient Details  Name: Belinda Norman MRN: [id number] Date of Birth: Jul 09, 1960  [facility]) CM/SW Contact:  Oluwatoni Rotunno, Lenice Llamas Phone Number: [phone]  06/24/2018, 9:32 AM   Clinical Narrative:   Patient will D/C home today and [place] has been arranged. [name] [facility] representative has been notified of D/C today. Patient has been notified of Lovenox price $50 and has no equipment needs. Please reconsult if future social work needs arise. CSW signing off.     Final next level of care: [place] Barriers to Discharge: Barriers Resolved   Patient Goals and CMS Choice Patient states their goals for this hospitalization and ongoing recovery are:: To improve mobility. CMS Medicare.gov Compare Post Acute Care list provided to:: Other (Comment Required)(Surgeon's office set up home health with [facility] prior to surgery.) Choice offered to / list presented to : Patient  Discharge Placement                       Discharge Plan and Services   Discharge Planning Services: CM Consult Post Acute Care Choice: Home Health          DME Arranged: N/A         HH Arranged: PT HH Agency: [facility] [facility] [employer] Date [place]: 06/24/18 Time [place]: [phone] Representative spoke with at [place]: [place] ([place]) Interventions     Readmission Risk Interventions No flowsheet data found.

## 2018-09-26 ENCOUNTER — Other Ambulatory Visit: Payer: Self-pay

## 2018-09-27 ENCOUNTER — Ambulatory Visit (INDEPENDENT_AMBULATORY_CARE_PROVIDER_SITE_OTHER): Payer: BC Managed Care – PPO | Admitting: Gastroenterology

## 2018-09-27 ENCOUNTER — Other Ambulatory Visit: Payer: Self-pay

## 2018-09-27 ENCOUNTER — Encounter: Payer: Self-pay | Admitting: Gastroenterology

## 2018-09-27 VITALS — BP 115/80 | HR 84 | Temp 97.4°F | Wt 159.2 lb

## 2018-09-27 DIAGNOSIS — Z1211 Encounter for screening for malignant neoplasm of colon: Secondary | ICD-10-CM | POA: Diagnosis not present

## 2018-09-27 DIAGNOSIS — K219 Gastro-esophageal reflux disease without esophagitis: Secondary | ICD-10-CM

## 2018-09-27 MED ORDER — FAMOTIDINE 20 MG PO TABS: 20.0000 mg | ORAL_TABLET | Freq: Two times a day (BID) | ORAL | 1 refills | Status: DC

## 2018-09-27 MED ORDER — BISACODYL EC 5 MG PO TBEC: DELAYED_RELEASE_TABLET | ORAL | 0 refills | Status: DC

## 2018-09-27 MED ORDER — NA SULFATE-K SULFATE-MG SULF 17.5-3.13-1.6 GM/177ML PO SOLN: 354.0000 mL | Freq: Once | ORAL | 0 refills | Status: AC

## 2018-09-27 NOTE — Patient Instructions (Addendum)
Gastroesophageal Reflux Disease, Adult Gastroesophageal reflux (GER) happens when acid from the stomach flows up into the tube that connects the mouth and the stomach (esophagus). Normally, food travels down the esophagus and stays in the stomach to be digested. With GER, food and stomach acid sometimes move back up into the esophagus. You may have a disease called gastroesophageal reflux disease (GERD) if the reflux:  Happens often.  Causes frequent or very bad symptoms.  Causes problems such as damage to the esophagus. When this happens, the esophagus becomes sore and swollen (inflamed). Over time, GERD can make small holes (ulcers) in the lining of the esophagus. What are the causes? This condition is caused by a problem with the muscle between the esophagus and the stomach. When this muscle is weak or not normal, it does not close properly to keep food and acid from coming back up from the stomach. The muscle can be weak because of:  Tobacco use.  Pregnancy.  Having a certain type of hernia (hiatal hernia).  Alcohol use.  Certain foods and drinks, such as coffee, chocolate, onions, and peppermint. What increases the risk? You are more likely to develop this condition if you:  Are overweight.  Have a disease that affects your connective tissue.  Use NSAID medicines. What are the signs or symptoms? Symptoms of this condition include:  Heartburn.  Difficult or painful swallowing.  The feeling of having a lump in the throat.  A bitter taste in the mouth.  Bad breath.  Having a lot of saliva.  Having an upset or bloated stomach.  Belching.  Chest pain. Different conditions can cause chest pain. Make sure you see your doctor if you have chest pain.  Shortness of breath or noisy breathing (wheezing).  Ongoing (chronic) cough or a cough at night.  Wearing away of the surface of teeth (tooth enamel).  Weight loss. How is this treated? Treatment will depend on how  bad your symptoms are. Your doctor may suggest:  Changes to your diet.  Medicine.  Surgery. Follow these instructions at home: Eating and drinking   Follow a diet as told by your doctor. You may need to avoid foods and drinks such as: ? Coffee and tea (with or without caffeine). ? Drinks that contain alcohol. ? Energy drinks and sports drinks. ? Bubbly (carbonated) drinks or sodas. ? Chocolate and cocoa. ? Peppermint and mint flavorings. ? Garlic and onions. ? Horseradish. ? Spicy and acidic foods. These include peppers, chili powder, curry powder, vinegar, hot sauces, and BBQ sauce. ? Citrus fruit juices and citrus fruits, such as oranges, lemons, and limes. ? Tomato-based foods. These include red sauce, chili, salsa, and pizza with red sauce. ? Fried and fatty foods. These include donuts, french fries, potato chips, and high-fat dressings. ? High-fat meats. These include hot dogs, rib eye steak, sausage, ham, and bacon. ? High-fat dairy items, such as whole milk, butter, and cream cheese.  Eat small meals often. Avoid eating large meals.  Avoid drinking large amounts of liquid with your meals.  Avoid eating meals during the 2-3 hours before bedtime.  Avoid lying down right after you eat.  Do not exercise right after you eat. Lifestyle   Do not use any products that contain nicotine or tobacco. These include cigarettes, e-cigarettes, and chewing tobacco. If you need help quitting, ask your doctor.  Try to lower your stress. If you need help doing this, ask your doctor.  If you are overweight, lose an amount  of weight that is healthy for you. Ask your doctor about a safe weight loss goal. General instructions  Pay attention to any changes in your symptoms.  Take over-the-counter and prescription medicines only as told by your doctor. Do not take aspirin, ibuprofen, or other NSAIDs unless your doctor says it is okay.  Wear loose clothes. Do not wear anything tight  around your waist.  Raise (elevate) the head of your bed about 6 inches (15 cm).  Avoid bending over if this makes your symptoms worse.  Keep all follow-up visits as told by your doctor. This is important. Contact a doctor if:  You have new symptoms.  You lose weight and you do not know why.  You have trouble swallowing or it hurts to swallow.  You have wheezing or a cough that keeps happening.  Your symptoms do not get better with treatment.  You have a hoarse voice. Get help right away if:  You have pain in your arms, neck, jaw, teeth, or back.  You feel sweaty, dizzy, or light-headed.  You have chest pain or shortness of breath.  You throw up (vomit) and your throw-up looks like blood or coffee grounds.  You pass out (faint).  Your poop (stool) is bloody or black.  You cannot swallow, drink, or eat. Summary  If a person has gastroesophageal reflux disease (GERD), food and stomach acid move back up into the esophagus and cause symptoms or problems such as damage to the esophagus.  Treatment will depend on how bad your symptoms are.  Follow a diet as told by your doctor.  Take all medicines only as told by your doctor. This information is not intended to replace advice given to you by your health care provider. Make sure you discuss any questions you have with your health care provider. Document Released: 06/17/2007 Document Revised: 07/07/2017 Document Reviewed: 07/07/2017 Elsevier Patient Education  2020 [employer]  FODMAPs (fermentable oligosaccharides, disaccharides, monosaccharides, and polyols) are sugars that are hard for some people to digest. A low-FODMAP eating plan may help some people who have bowel (intestinal) diseases to manage their symptoms. This meal plan can be complicated to follow. Work with a diet and nutrition specialist (dietitian) to make a low-FODMAP eating plan that is right for you. A dietitian can make sure  that you get enough nutrition from this diet. What are tips for following this plan? Reading food labels  Check labels for hidden FODMAPs such as: ? High-fructose syrup. ? Honey. ? Agave. ? Natural fruit flavors. ? Onion or garlic powder.  Choose low-FODMAP foods that contain 3-4 grams of fiber per serving.  Check food labels for serving sizes. Eat only one serving at a time to make sure FODMAP levels stay low. Meal planning  Follow a low-FODMAP eating plan for up to 6 weeks, or as told by your health care provider or dietitian.  To follow the eating plan: 1. Eliminate high-FODMAP foods from your diet completely. 2. Gradually reintroduce high-FODMAP foods into your diet one at a time. Most people should wait a few days after introducing one high-FODMAP food before they introduce the next high-FODMAP food. Your dietitian can recommend how quickly you may reintroduce foods. 3. Keep a daily record of what you eat and drink, and make note of any symptoms that you have after eating. 4. Review your daily record with a dietitian regularly. Your dietitian can help you identify which foods you can eat and which foods you  should avoid. General tips  Drink enough fluid each day to keep your urine pale yellow.  Avoid processed foods. These often have added sugar and may be high in FODMAPs.  Avoid most dairy products, whole grains, and sweeteners.  Work with a dietitian to make sure you get enough fiber in your diet. Recommended foods Grains  Gluten-free grains, such as rice, oats, buckwheat, quinoa, corn, polenta, and millet. Gluten-free pasta, bread, or cereal. Rice noodles. Corn tortillas. Vegetables  Eggplant, zucchini, cucumber, peppers, green beans, Brussels sprouts, bean sprouts, lettuce, arugula, kale, Swiss chard, spinach, collard greens, bok choy, summer squash, potato, and tomato. Limited amounts of corn, carrot, and sweet potato. Green parts of scallions. Fruits  Bananas,  oranges, lemons, limes, blueberries, raspberries, strawberries, grapes, cantaloupe, honeydew melon, kiwi, papaya, passion fruit, and pineapple. Limited amounts of dried cranberries, banana chips, and shredded coconut. Dairy  Lactose-free milk, yogurt, and kefir. Lactose-free cottage cheese and ice cream. Non-dairy milks, such as almond, coconut, hemp, and rice milk. Yogurts made of non-dairy milks. Limited amounts of goat cheese, brie, mozzarella, parmesan, swiss, and other hard cheeses. Meats and other protein foods  Unseasoned beef, pork, poultry, or fish. Eggs. Berniece Salines. Tofu (firm) and tempeh. Limited amounts of nuts and seeds, such as almonds, walnuts, [place] nuts, pecans, peanuts, pumpkin seeds, chia seeds, and sunflower seeds. Fats and oils  Butter-free spreads. Vegetable oils, such as olive, canola, and sunflower oil. Seasoning and other foods  Artificial sweeteners with names that do not end in "ol" such as aspartame, saccharine, and stevia. Maple syrup, white table sugar, raw sugar, brown sugar, and molasses. Fresh basil, coriander, parsley, rosemary, and thyme. Beverages  Water and mineral water. Sugar-sweetened soft drinks. Small amounts of orange juice or cranberry juice. Black and green tea. Most dry wines. Coffee. This may not be a complete list of low-FODMAP foods. Talk with your dietitian for more information. Foods to avoid Grains  Wheat, including kamut, durum, and semolina. Barley and bulgur. Couscous. Wheat-based cereals. Wheat noodles, bread, crackers, and pastries. Vegetables  Chicory root, artichoke, asparagus, cabbage, snow peas, sugar snap peas, mushrooms, and cauliflower. Onions, garlic, leeks, and the white part of scallions. Fruits  Fresh, dried, and juiced forms of apple, pear, watermelon, peach, plum, cherries, apricots, blackberries, boysenberries, figs, nectarines, and mango. Avocado. Dairy  Milk, yogurt, ice cream, and soft cheese. Cream and sour cream.  Milk-based sauces. Custard. Meats and other protein foods  Fried or fatty meat. Sausage. Cashews and pistachios. Soybeans, baked beans, black beans, chickpeas, kidney beans, fava beans, navy beans, lentils, and split peas. Seasoning and other foods  Any sugar-free gum or candy. Foods that contain artificial sweeteners such as sorbitol, mannitol, isomalt, or xylitol. Foods that contain honey, high-fructose corn syrup, or agave. Bouillon, vegetable stock, beef stock, and chicken stock. Garlic and onion powder. Condiments made with onion, such as hummus, chutney, pickles, relish, salad dressing, and salsa. Tomato paste. Beverages  Chicory-based drinks. Coffee substitutes. Chamomile tea. Fennel tea. Sweet or fortified wines such as port or sherry. Diet soft drinks made with isomalt, mannitol, maltitol, sorbitol, or xylitol. Apple, pear, and mango juice. Juices with high-fructose corn syrup. This may not be a complete list of high-FODMAP foods. Talk with your dietitian to discuss what dietary choices are best for you.  Summary  A low-FODMAP eating plan is a short-term diet that eliminates FODMAPs from your diet to help ease symptoms of certain bowel diseases.  The eating plan usually lasts up to 6 weeks. After that,  high-FODMAP foods are restarted gradually, one at a time, so you can find out which may be causing symptoms.  A low-FODMAP eating plan can be complicated. It is best to work with a dietitian who has experience with this type of plan. This information is not intended to replace advice given to you by your health care provider. Make sure you discuss any questions you have with your health care provider. Document Released: 08/25/2016 Document Revised: 12/11/2016 Document Reviewed: 08/25/2016 Elsevier Patient Education  2020 [employer].

## 2018-09-27 NOTE — Progress Notes (Addendum)
Bel[facility] [place]  [address], [place] [address]  Main: [phone]  Fax: [phone]   Gastroenterology Consultation  Referring Provider:     Nicoletta Dress, MD Primary Care Physician:  Nicoletta Dress, MD Reason for Consultation:    GERD        HPI:    Chief Complaint  Patient presents with  . New Patient (Initial Visit)  . Gastroesophageal Reflux    Patient states symptoms are bad. States she has had belching, Alot of fulnees, gas, cough     Belinda Norman is a 58 y.o. y/o female referred for consultation & management  by Dr. Nicoletta Dress, MD.  Patient presents for evaluation of midepigastric abdominal pain, bloating, belching, regurgitation.  Symptoms are chronic but reports worsening over the last month.  Patient has had previous GI work-up at [facility] and evaluation at [facility].  Please see scanned reports.  Midepigastric pain is dull, 5/10, nonradiating, associated with nausea but no vomiting.  No blood in stool.  Reports alternating constipation and diarrhea with no specific pattern to her symptoms.  This is chronic.  Also reports gas and bloating intermittently.  No weight loss.  No family history of colon cancer.  Last GI evaluation was in December 2019 at [facility].  This note reports 20+ years of dyspepsia with epigastric pain.  Their impression was "female with a 20+ year history of dyspepsia and a negative work-up to date that has included several EGDs, colonoscopy, breath testing for SIBO and treatment for H pylori.  She has been refractory to twice daily therapy.  This is most consistent with functional dyspepsia which we discussed at length."  Patient states she was not satisfied after this visit at [facility] and feels her symptoms have worsened. Addendum: Further records obtained today 09/29/2018 EGD, July 2018 with Dr. Earlean Shawl Normal esophagus, portal hypertensive gastropathy.  Biopsies obtained  Colonoscopy July 2018 for colorectal  cancer screening.  Reports no neoplasia, normal ileoscopy, normal colonoscopy examination.  Internal hemorrhoids EGD 2017: Impression:    - Two plaques at the gastroesophageal junction. Biopsied.           - Erythematous mucosa in the antrum. Biopsied.           - The examination was otherwise normal.  I do not have the pathology report from this procedure  EGD 2015: Indications "the patient is a 58 year old white female with recent partial therapy for Helicobacter, based on positive serologies and symptoms of nausea and epigastric pain.  She had difficulty tolerating antibiotic therapy which was stopped prematurely.  Nausea and epigastric pain continue."  Impression was antritis, mild, nonerosive with CLO biopsies performed.  Normal esophagus with no esophageal varices.  Pathology report not available  Also has history of hepatitis C which was treated in 2014 and she sees Dr. Angela Adam every 6 months and states she has been in remission.  Last lab showing HCV RNA that I have available was in 2015 and it was undetectable.    Past Medical History:  Diagnosis Date  . Anxiety   . Arthritis   . Cancer [facility])    breast cancer. had bilateral mastectomies  . DCIS (ductal carcinoma in situ)   . Depression   . GERD (gastroesophageal reflux disease)   . Hepatitis C    cured in 2014  . HSV (herpes simplex virus) infection   . IBS (irritable bowel syndrome)     Past Surgical History:  Procedure  Laterality Date  . arthroscopic knee surgery     x2  . BREAST SURGERY     mastectomies then reconstructive surgery times 2.  . CESAREAN SECTION    . CHOLECYSTECTOMY  10/1998  . FOOT SURGERY    . KNEE ARTHROPLASTY Right 06/22/2018   Procedure: COMPUTER ASSISTED TOTAL KNEE ARTHROPLASTY RIGHT - Will need an RNFA;  Surgeon: Dereck Leep, MD;  Location: [facility] ORS;  Service: Orthopedics;  Laterality: Right;  . knee surgery with nerve repair    . MASTECTOMY, MODIFIED RADICAL  W/RECONSTRUCTION    . TONSILLECTOMY    . TOTAL ABDOMINAL HYSTERECTOMY W/ BILATERAL SALPINGOOPHORECTOMY  1999    Prior to Admission medications   Medication Sig Start Date End Date Taking? Authorizing Provider  acetaminophen (TYLENOL) 500 MG tablet Take 500 mg by mouth every 6 (six) hours as needed for moderate pain.   Yes [provider]  acyclovir (ZOVIRAX) 400 MG tablet Take 400 mg by mouth 5 (five) times daily as needed.   Yes [provider]  albuterol (VENTOLIN HFA) 108 (90 Base) MCG/ACT inhaler Inhale 2 puffs into the lungs every 6 (six) hours as needed for wheezing.   Yes [provider]  azelastine (ASTELIN) 0.1 % nasal spray Place 2 sprays into the nose 2 (two) times daily. 04/11/18 04/11/19 Yes [provider]  Cholecalciferol (VITAMIN D3) 1000 units CAPS Take 2,000 Units by mouth daily.    Yes [provider]  citalopram (CELEXA) 10 MG tablet Take 10 mg by mouth daily.   Yes [provider]  clonazePAM (KLONOPIN) 1 MG tablet Take 1 mg by mouth 2 (two) times daily as needed for anxiety.   Yes [provider]  DULoxetine (CYMBALTA) 30 MG capsule Take 90 mg by mouth daily. 12/15/15  Yes [provider]  fexofenadine (ALLEGRA) 180 MG tablet Take 180 mg by mouth daily.   Yes [provider]  fluticasone (FLONASE) 50 MCG/ACT nasal spray Place 2 sprays into both nostrils daily as needed for allergies or rhinitis.   Yes [provider]  hyoscyamine (ANASPAZ) 0.125 MG TBDP disintergrating tablet Place 0.125 mg under the tongue every 4 (four) hours as needed.   Yes [provider]  bisacodyl (BISACODYL) 5 MG EC tablet Take 2 tablets (10mg ) by mouth the day before your procedure between 1pm-3pm. 09/27/18   Virgel Manifold, MD  famotidine (PEPCID) 20 MG tablet Take 1 tablet (20 mg total) by mouth 2 (two) times daily. 09/27/18   Virgel Manifold, MD  Na Sulfate-K Sulfate-Mg Sulf 17.5-3.13-1.6  GM/177ML SOLN Take 354 mLs by mouth once for 1 dose. 09/27/18 09/27/18  Virgel Manifold, MD    Family History  Problem Relation Age of Onset  . Hypertension Father   . Liver cancer Mother        bile duct cancer  . Alcohol abuse Mother   . Diabetes Brother   . Breast cancer Paternal Aunt   . Throat cancer Paternal Grandmother   . Prostate cancer Paternal Grandfather   . Colon cancer Neg Hx      Social History   Tobacco Use  . Smoking status: Never Smoker  . Smokeless tobacco: Never Used  Substance Use Topics  . Alcohol use: Yes    Comment: rare  . Drug use: No    Allergies as of 09/27/2018 - Review Complete 09/27/2018  Allergen Reaction Noted  . Percocet [oxycodone-acetaminophen] Nausea And Vomiting 06/10/2018  . Zofran [ondansetron hcl] Other (See  Comments) 08/31/2016  . Codeine Nausea And Vomiting and Nausea Only 11/18/2011  . Epinephrine Other (See Comments) 02/11/2016  . Morphine and related Other (See Comments) 06/10/2018    Review of Systems:    All systems reviewed and negative except where noted in HPI.   Physical Exam:  BP 115/80 (BP Location: Left Arm, Patient Position: Sitting, Cuff Size: Normal)   Pulse 84   Temp (!) 97.4 F (36.3 C) (Tympanic)   Wt 159 lb 4 oz (72.2 kg)   BMI 25.32 kg/m  No LMP recorded. Patient has had a hysterectomy. Psych:  Alert and cooperative. Normal mood and affect. General:   Alert,  Well-developed, well-nourished, pleasant and cooperative in NAD Head:  Normocephalic and atraumatic. Eyes:  Sclera clear, no icterus.   Conjunctiva pink. Ears:  Normal auditory acuity. Nose:  No deformity, discharge, or lesions. Mouth:  No deformity or lesions,oropharynx pink & moist. Neck:  Supple; no masses or thyromegaly. Abdomen:  Normal bowel sounds.  No bruits.  Soft, non-tender and non-distended without masses, hepatosplenomegaly or hernias noted.  No guarding or rebound tenderness.    Msk:  Symmetrical without gross deformities.  Good, equal movement & strength bilaterally. Pulses:  Normal pulses noted. Extremities:  No clubbing or edema.  No cyanosis. Neurologic:  Alert and oriented x3;  grossly normal neurologically. Skin:  Intact without significant lesions or rashes. No jaundice. Lymph Nodes:  No significant cervical adenopathy. Psych:  Alert and cooperative. Normal mood and affect.   Labs: CBC    Component Value Date/Time   WBC 4.6 03/30/2018 1139   RBC 4.73 03/30/2018 1139   HGB 14.9 03/30/2018 1139   HCT 43.8 03/30/2018 1139   PLT 162 03/30/2018 1139   MCV 92.6 03/30/2018 1139   MCH 31.5 03/30/2018 1139   MCHC 34.0 03/30/2018 1139   RDW 12.0 03/30/2018 1139   LYMPHSABS 0.8 10/11/2013 1043   MONOABS 0.7 10/11/2013 1043   EOSABS 0.2 10/11/2013 1043   BASOSABS 0.0 10/11/2013 1043   CMP     Component Value Date/Time   NA 138 03/30/2018 1139   K 3.9 03/30/2018 1139   CL 103 03/30/2018 1139   CO2 27 03/30/2018 1139   GLUCOSE 100 (H) 03/30/2018 1139   BUN 15 03/30/2018 1139   CREATININE 0.80 06/24/2018 0631   CALCIUM 9.2 03/30/2018 1139   PROT 7.1 03/30/2018 1139   ALBUMIN 4.4 03/30/2018 1139   AST 17 03/30/2018 1139   ALT 20 03/30/2018 1139   ALKPHOS 56 03/30/2018 1139   BILITOT 0.5 03/30/2018 1139   GFRNONAA >60 06/24/2018 0631   GFRAA >60 06/24/2018 0631    Imaging Studies: No results found.  Assessment and Plan:   Belinda Norman is a 58 y.o. y/o female has been referred for GERD  Patient reports recent worsening of symptoms over the last month. She has never had a manometry or pH study  It has been 3 years since her last upper endoscopy and since she reports worsening of symptoms recently (off PPIs she states was not helping) patient is interested in endoscopic evaluation.  Also reports last colonoscopy was 7 to 10 years ago and does not know the results.  We do not have her colonoscopy reports (Obtained, see above addendum).  EGD indicated for daily GERD symptoms without  relief with PPI Colonoscopy indicated for diarrhea and biopsies to rule out microscopic colitis (Last colonoscopy was normal, but no biopsies were done)  If above work-up is negative, patient may need  manometry with pH study given ongoing symptoms  However, willing to try Pepcid  Patient educated extensively on acid reflux lifestyle modification, including buying a bed wedge, not eating 3 hrs before bedtime, diet modifications, and handout given for the same.   I have discussed alternative options, risks & benefits,  which include, but are not limited to, bleeding, infection, perforation,respiratory complication & drug reaction.  The patient agrees with this plan & written consent will be obtained.    Also discussed FODMAP diet due to her symptoms of bloating  Previous notes report partial treatment of H. pylori and therefore biopsies during EGD would allow for reevaluating if H. pylori is in fact still present and contributing to her symptoms  We also discussed options of conservative management with medications instead of endoscopy.  Patient would like to proceed with endoscopy and is anxious about her symptoms and finding out a diagnosis as she states she feels miserable.  She sees [facility], Dr. Angela Adam, for history of cirrhosis and hepatitis C and would like to continue follow-up with them for hep C. Continue cirrhosis and Hep C management as per them.   Dr Belinda Norman  Speech recognition software was used to dictate the above note.

## 2018-10-05 ENCOUNTER — Encounter: Payer: Self-pay | Admitting: Gastroenterology

## 2018-10-25 ENCOUNTER — Other Ambulatory Visit: Payer: Self-pay

## 2018-10-25 ENCOUNTER — Other Ambulatory Visit
Admission: RE | Admit: 2018-10-25 | Discharge: 2018-10-25 | Disposition: A | Discharge status: Home / Self Care | Payer: BC Managed Care – PPO | Source: Ambulatory Visit | Attending: Gastroenterology | Admitting: Gastroenterology

## 2018-10-25 DIAGNOSIS — Z20828 Contact with and (suspected) exposure to other viral communicable diseases: Secondary | ICD-10-CM | POA: Diagnosis not present

## 2018-10-25 DIAGNOSIS — Z01812 Encounter for preprocedural laboratory examination: Secondary | ICD-10-CM | POA: Insufficient documentation

## 2018-10-25 LAB — SARS CORONAVIRUS 2 (TAT 6-24 HRS): SARS Coronavirus 2: NEGATIVE

## 2018-10-27 ENCOUNTER — Encounter: Payer: Self-pay | Admitting: *Deleted

## 2018-10-28 ENCOUNTER — Ambulatory Visit: Admit: 2018-10-28 | Payer: BC Managed Care – PPO | Admitting: Gastroenterology

## 2018-10-28 ENCOUNTER — Ambulatory Visit: Payer: BC Managed Care – PPO | Admitting: Certified Registered"

## 2018-10-28 ENCOUNTER — Ambulatory Visit
Admission: RE | Admit: 2018-10-28 | Discharge: 2018-10-28 | Disposition: A | Discharge status: Home / Self Care | Payer: BC Managed Care – PPO | Attending: Gastroenterology | Admitting: Gastroenterology

## 2018-10-28 ENCOUNTER — Other Ambulatory Visit: Payer: Self-pay

## 2018-10-28 ENCOUNTER — Encounter
Admission: RE | Disposition: A | Discharge status: Home / Self Care | Payer: Self-pay | Source: Home / Self Care | Attending: Gastroenterology

## 2018-10-28 DIAGNOSIS — R1013 Epigastric pain: Secondary | ICD-10-CM

## 2018-10-28 DIAGNOSIS — B009 Herpesviral infection, unspecified: Secondary | ICD-10-CM | POA: Diagnosis not present

## 2018-10-28 DIAGNOSIS — Z79899 Other long term (current) drug therapy: Secondary | ICD-10-CM | POA: Insufficient documentation

## 2018-10-28 DIAGNOSIS — F329 Major depressive disorder, single episode, unspecified: Secondary | ICD-10-CM | POA: Insufficient documentation

## 2018-10-28 DIAGNOSIS — K648 Other hemorrhoids: Secondary | ICD-10-CM | POA: Insufficient documentation

## 2018-10-28 DIAGNOSIS — K219 Gastro-esophageal reflux disease without esophagitis: Secondary | ICD-10-CM | POA: Insufficient documentation

## 2018-10-28 DIAGNOSIS — Z9013 Acquired absence of bilateral breasts and nipples: Secondary | ICD-10-CM | POA: Insufficient documentation

## 2018-10-28 DIAGNOSIS — F419 Anxiety disorder, unspecified: Secondary | ICD-10-CM | POA: Insufficient documentation

## 2018-10-28 DIAGNOSIS — Z853 Personal history of malignant neoplasm of breast: Secondary | ICD-10-CM | POA: Diagnosis not present

## 2018-10-28 DIAGNOSIS — Z1211 Encounter for screening for malignant neoplasm of colon: Secondary | ICD-10-CM

## 2018-10-28 DIAGNOSIS — R12 Heartburn: Secondary | ICD-10-CM | POA: Diagnosis not present

## 2018-10-28 DIAGNOSIS — K635 Polyp of colon: Secondary | ICD-10-CM | POA: Diagnosis not present

## 2018-10-28 DIAGNOSIS — K2950 Unspecified chronic gastritis without bleeding: Secondary | ICD-10-CM | POA: Insufficient documentation

## 2018-10-28 DIAGNOSIS — K3189 Other diseases of stomach and duodenum: Secondary | ICD-10-CM | POA: Diagnosis not present

## 2018-10-28 HISTORY — PX: ESOPHAGOGASTRODUODENOSCOPY (EGD) WITH PROPOFOL: SHX5813

## 2018-10-28 HISTORY — PX: COLONOSCOPY WITH PROPOFOL: SHX5780

## 2018-10-28 SURGERY — COLONOSCOPY WITH PROPOFOL
Anesthesia: Choice

## 2018-10-28 SURGERY — COLONOSCOPY WITH PROPOFOL
Anesthesia: General

## 2018-10-28 MED ORDER — MIDAZOLAM HCL 2 MG/2ML IJ SOLN
INTRAMUSCULAR | Status: AC
  Filled 2018-10-28: qty 2

## 2018-10-28 MED ORDER — LIDOCAINE HCL (PF) 2 % IJ SOLN
INTRAMUSCULAR | Status: AC
  Filled 2018-10-28: qty 80

## 2018-10-28 MED ORDER — GLYCOPYRROLATE 0.2 MG/ML IJ SOLN
INTRAMUSCULAR | Status: AC
  Filled 2018-10-28: qty 7

## 2018-10-28 MED ORDER — SODIUM CHLORIDE 0.9 % IV SOLN
INTRAVENOUS | Status: DC
  Administered 2018-10-28 (×2): via INTRAVENOUS

## 2018-10-28 MED ORDER — LIDOCAINE HCL (CARDIAC) PF 100 MG/5ML IV SOSY
PREFILLED_SYRINGE | INTRAVENOUS | Status: DC | PRN
  Administered 2018-10-28: 100 mg via INTRATRACHEAL

## 2018-10-28 MED ORDER — PROPOFOL 10 MG/ML IV BOLUS
INTRAVENOUS | Status: DC | PRN
  Administered 2018-10-28 (×2): 50 mg via INTRAVENOUS

## 2018-10-28 MED ORDER — GLYCOPYRROLATE 0.2 MG/ML IJ SOLN
INTRAMUSCULAR | Status: DC | PRN
  Administered 2018-10-28: 0.2 mg via INTRAVENOUS

## 2018-10-28 MED ORDER — EPHEDRINE SULFATE 50 MG/ML IJ SOLN
INTRAMUSCULAR | Status: AC
  Filled 2018-10-28: qty 1

## 2018-10-28 MED ORDER — MIDAZOLAM HCL 2 MG/2ML IJ SOLN
INTRAMUSCULAR | Status: DC | PRN
  Administered 2018-10-28: 2 mg via INTRAVENOUS

## 2018-10-28 MED ORDER — PROPOFOL 500 MG/50ML IV EMUL
INTRAVENOUS | Status: DC | PRN
  Administered 2018-10-28: 140 ug/kg/min via INTRAVENOUS

## 2018-10-28 MED ORDER — PHENYLEPHRINE HCL (PRESSORS) 10 MG/ML IV SOLN
INTRAVENOUS | Status: AC
  Filled 2018-10-28: qty 1

## 2018-10-28 NOTE — Anesthesia Post-op Follow-up Note (Signed)
Anesthesia QCDR form completed.        

## 2018-10-28 NOTE — H&P (Signed)
Belinda Antigua, MD [address] [place], [place], [place], [address] [address], [place], [place], [place], [address] Phone: [phone]  Fax: [phone]  Primary Care Physician:  Nicoletta Dress, MD   Pre-Procedure History & Physical: HPI:  Belinda Norman is a 58 y.o. female is here for a colonoscopy and EGD.   Past Medical History:  Diagnosis Date  . Anxiety   . Arthritis   . Cancer [facility])    breast cancer. had bilateral mastectomies  . DCIS (ductal carcinoma in situ)   . Depression   . GERD (gastroesophageal reflux disease)   . Hepatitis C    cured in 2014  . HSV (herpes simplex virus) infection   . IBS (irritable bowel syndrome)     Past Surgical History:  Procedure Laterality Date  . ABDOMINAL HYSTERECTOMY    . arthroscopic knee surgery     x2  . BREAST SURGERY     mastectomies then reconstructive surgery times 2.  . CESAREAN SECTION    . CHOLECYSTECTOMY  10/1998  . FOOT SURGERY    . KNEE ARTHROPLASTY Right 06/22/2018   Procedure: COMPUTER ASSISTED TOTAL KNEE ARTHROPLASTY RIGHT - Will need an RNFA;  Surgeon: Dereck Leep, MD;  Location: [facility] ORS;  Service: Orthopedics;  Laterality: Right;  . knee surgery with nerve repair    . MASTECTOMY Bilateral   . MASTECTOMY, MODIFIED RADICAL W/RECONSTRUCTION    . TONSILLECTOMY    . TOTAL ABDOMINAL HYSTERECTOMY W/ BILATERAL SALPINGOOPHORECTOMY  1999    Prior to Admission medications   Medication Sig Start Date End Date Taking? Authorizing Provider  acetaminophen (TYLENOL) 500 MG tablet Take 500 mg by mouth every 6 (six) hours as needed for moderate pain.   Yes [provider]  clonazePAM (KLONOPIN) 1 MG tablet Take 1 mg by mouth daily as needed for anxiety.    Yes [provider]  clonazePAM (KLONOPIN) 1 MG tablet Take 1 mg by mouth daily as needed for anxiety.   Yes [provider]  DULoxetine (CYMBALTA) 30 MG capsule Take 90 mg by mouth daily. 12/15/15  Yes [provider]   famotidine (PEPCID) 20 MG tablet Take 1 tablet (20 mg total) by mouth 2 (two) times daily. 09/27/18  Yes Virgel Manifold, MD  hyoscyamine (ANASPAZ) 0.125 MG TBDP disintergrating tablet Place 0.125 mg under the tongue every 4 (four) hours as needed.   Yes [provider]  acyclovir (ZOVIRAX) 400 MG tablet Take 400 mg by mouth 5 (five) times daily as needed.    [provider]  albuterol (VENTOLIN HFA) 108 (90 Base) MCG/ACT inhaler Inhale 2 puffs into the lungs every 6 (six) hours as needed for wheezing.    [provider]  azelastine (ASTELIN) 0.1 % nasal spray Place 2 sprays into the nose 2 (two) times daily. 04/11/18 04/11/19  [provider]  bisacodyl (BISACODYL) 5 MG EC tablet Take 2 tablets (10mg ) by mouth the day before your procedure between 1pm-3pm. Patient not taking: Reported on 10/28/2018 09/27/18   Virgel Manifold, MD  Cholecalciferol (VITAMIN D3) 1000 units CAPS Take 2,000 Units by mouth daily.     [provider]  citalopram (CELEXA) 10 MG tablet Take 10 mg by mouth daily.    [provider]  fexofenadine (ALLEGRA) 180 MG tablet Take 180 mg by mouth daily.    [provider]  fluticasone (FLONASE) 50 MCG/ACT nasal spray Place 2 sprays into both nostrils daily as needed for allergies or  rhinitis.    [provider]    Allergies as of 09/28/2018 - Review Complete 09/27/2018  Allergen Reaction Noted  . Percocet [oxycodone-acetaminophen] Nausea And Vomiting 06/10/2018  . Zofran [ondansetron hcl] Other (See Comments) 08/31/2016  . Codeine Nausea And Vomiting and Nausea Only 11/18/2011  . Epinephrine Other (See Comments) 02/11/2016  . Morphine and related Other (See Comments) 06/10/2018    Family History  Problem Relation Age of Onset  . Hypertension Father   . Liver cancer Mother        bile duct cancer  . Alcohol abuse Mother   . Diabetes Brother   . Breast cancer Paternal Aunt   . Throat cancer  Paternal Grandmother   . Prostate cancer Paternal Grandfather   . Colon cancer Neg Hx     Social History   Socioeconomic History  . Marital status: Divorced    Spouse name: has a significant other  . Number of children: 3  . Years of education: Not on file  . Highest education level: Not on file  Occupational History  . Not on file  Social Needs  . Financial resource strain: Not on file  . Food insecurity    Worry: Not on file    Inability: Not on file  . Transportation needs    Medical: Not on file    Non-medical: Not on file  Tobacco Use  . Smoking status: Never Smoker  . Smokeless tobacco: Never Used  Substance and Sexual Activity  . Alcohol use: Yes    Comment: none last 24hrs  . Drug use: Yes    Types: Marijuana  . Sexual activity: Yes  Lifestyle  . Physical activity    Days per week: Not on file    Minutes per session: Not on file  . Stress: Not on file  Relationships  . Social Herbalist on phone: Not on file    Gets together: Not on file    Attends religious service: Not on file    Active member of club or organization: Not on file    Attends meetings of clubs or organizations: Not on file    Relationship status: Not on file  . Intimate partner violence    Fear of current or ex partner: Not on file    Emotionally abused: Not on file    Physically abused: Not on file    Forced sexual activity: Not on file  Other Topics Concern  . Not on file  Social History Narrative  . Not on file    Review of Systems: See HPI, otherwise negative ROS  Physical Exam: BP 115/87   Pulse 89   Temp 98.2 F (36.8 C) (Oral)   Ht 5\' 7"  (1.702 m)   Wt 71.2 kg   SpO2 100%   BMI 24.59 kg/m  General:   Alert,  pleasant and cooperative in NAD Head:  Normocephalic and atraumatic. Neck:  Supple; no masses or thyromegaly. Lungs:  Clear throughout to auscultation, normal respiratory effort.    Heart:  +S1, +S2, Regular rate and rhythm, No edema. Abdomen:   Soft, nontender and nondistended. Normal bowel sounds, without guarding, and without rebound.   Neurologic:  Alert and  oriented x4;  grossly normal neurologically.  Impression/Plan: Belinda Norman is here for a colonoscopy to be performed for average risk screening and EGD for Acid Reflux.  Risks, benefits, limitations, and alternatives regarding the procedures have been reviewed with the patient.  Questions have been  answered.  All parties agreeable.   Virgel Manifold, MD  10/28/2018, 9:13 AM

## 2018-10-28 NOTE — Anesthesia Preprocedure Evaluation (Signed)
Anesthesia Evaluation  Patient identified by MRN, date of birth, ID band Patient awake    Reviewed: Allergy & Precautions, H&P , NPO status , Patient's Chart, lab work & pertinent test results  History of Anesthesia Complications Negative for: history of anesthetic complications  Airway Mallampati: II  TM Distance: >3 FB Neck ROM: full    Dental  (+) Chipped, Poor Dentition, Missing   Pulmonary neg pulmonary ROS, neg shortness of breath,           Cardiovascular Exercise Tolerance: Good (-) angina(-) Past MI and (-) DOE negative cardio ROS       Neuro/Psych PSYCHIATRIC DISORDERS negative neurological ROS     GI/Hepatic GERD  Medicated and Controlled,(+) Hepatitis -  Endo/Other  negative endocrine ROS  Renal/GU      Musculoskeletal  (+) Arthritis ,   Abdominal   Peds  Hematology negative hematology ROS (+)   Anesthesia Other Findings Past Medical History: No date: Anxiety No date: Arthritis No date: Cancer [facility])     Comment:  breast cancer. had bilateral mastectomies No date: DCIS (ductal carcinoma in situ) No date: Depression No date: GERD (gastroesophageal reflux disease) No date: Hepatitis C     Comment:  cured in 2014 No date: HSV (herpes simplex virus) infection No date: IBS (irritable bowel syndrome)  Past Surgical History: No date: arthroscopic knee surgery     Comment:  x2 No date: BREAST SURGERY     Comment:  mastectomies then reconstructive surgery times 2. No date: CESAREAN SECTION 10/1998: CHOLECYSTECTOMY No date: FOOT SURGERY No date: knee surgery with nerve repair No date: MASTECTOMY, MODIFIED RADICAL W/RECONSTRUCTION No date: TONSILLECTOMY 1999: TOTAL ABDOMINAL HYSTERECTOMY W/ BILATERAL SALPINGOOPHORECTOMY  BMI    Body Mass Index:  25.20 kg/m      Reproductive/Obstetrics negative OB ROS                             Anesthesia Physical  Anesthesia  Plan  ASA: III  Anesthesia Plan: General   Post-op Pain Management:    Induction: Intravenous  PONV Risk Score and Plan: Treatment may vary due to age or medical condition, Propofol infusion and TIVA  Airway Management Planned: Natural Airway and Nasal Cannula  Additional Equipment:   Intra-op Plan:   Post-operative Plan:   Informed Consent: I have reviewed the patients History and Physical, chart, labs and discussed the procedure including the risks, benefits and alternatives for the proposed anesthesia with the patient or authorized representative who has indicated his/her understanding and acceptance.     Dental Advisory Given  Plan Discussed with: Anesthesiologist, CRNA and Surgeon  Anesthesia Plan Comments: (Patient with chronic back pain who declines spinal  Patient consented for risks of anesthesia including but not limited to:  - adverse reactions to medications - damage to teeth, lips or other oral mucosa - sore throat or hoarseness - Damage to heart, brain, lungs or loss of life  Patient voiced understanding.)        Anesthesia Quick Evaluation

## 2018-10-28 NOTE — Op Note (Signed)
[facility] Gastroenterology Patient Name: Belinda Norman Procedure Date: 10/28/2018 10:02 AM MRN: [id number] Account #: [id number] Date of Birth: 1960/06/06 Admit Type: Outpatient Age: 58 Room: [facility] ENDO ROOM 3 Gender: Female Note Status: Finalized Procedure:            Colonoscopy Indications:          Screening for colorectal malignant neoplasm Providers:            Dhanya Bogle B. Bonna Gains MD, MD Referring MD:         Nicoletta Dress (Referring MD) Medicines:            Monitored Anesthesia Care Complications:        No immediate complications. Procedure:            Pre-Anesthesia Assessment:                       - ASA Grade Assessment: II - A patient with mild                        systemic disease.                       - Prior to the procedure, a History and Physical was                        performed, and patient medications, allergies and                        sensitivities were reviewed. The patient's tolerance of                        previous anesthesia was reviewed.                       - The risks and benefits of the procedure and the                        sedation options and risks were discussed with the                        patient. All questions were answered and informed                        consent was obtained.                       - Patient identification and proposed procedure were                        verified prior to the procedure by the physician, the                        nurse, the anesthesiologist, the anesthetist and the                        technician. The procedure was verified in the procedure                        room.                       After  obtaining informed consent, the colonoscope was                        passed under direct vision. Throughout the procedure,                        the patient's blood pressure, pulse, and oxygen                        saturations were monitored continuously. The               Colonoscope was introduced through the anus and                        advanced to the the cecum, identified by appendiceal                        orifice and ileocecal valve. The colonoscopy was                        performed with ease. The patient tolerated the                        procedure well. The quality of the bowel preparation                        was good. Findings:      The perianal and digital rectal examinations were normal.      A 6 mm polyp was found in the sigmoid colon. The polyp was sessile. The       polyp was removed with a cold snare. Resection and retrieval were       complete. To prevent bleeding after the polypectomy, one hemostatic clip       was successfully placed. There was no bleeding at the end of the       procedure.      The exam was otherwise without abnormality.      The rectum, sigmoid colon, descending colon, transverse colon, ascending       colon and cecum appeared normal.      Non-bleeding internal hemorrhoids were found during retroflexion. Impression:           - One 6 mm polyp in the sigmoid colon, removed with a                        cold snare. Resected and retrieved. Clip was placed.                       - The examination was otherwise normal.                       - The rectum, sigmoid colon, descending colon,                        transverse colon, ascending colon and cecum are normal.                       - Non-bleeding internal hemorrhoids. Recommendation:       - Discharge patient to home (with escort).                       -  Advance diet as tolerated.                       - Continue present medications.                       - Await pathology results.                       - Repeat colonoscopy in 5 years.                       - The findings and recommendations were discussed with                        the patient.                       - The findings and recommendations were discussed with                         the patient's family.                       - Return to primary care physician as previously                        scheduled.                       - High fiber diet. Procedure Code(s):    --- Professional ---                       [phone], Colonoscopy, flexible; with removal of tumor(s),                        polyp(s), or other lesion(s) by snare technique Diagnosis Code(s):    --- Professional ---                       Z12.11, Encounter for screening for malignant neoplasm                        of colon                       K63.5, Polyp of colon                       K64.8, Other hemorrhoids CPT copyright 2019 American Medical Association. All rights reserved. The codes documented in this report are preliminary and upon coder review may  be revised to meet current compliance requirements.  [name], MD Margretta Sidle B. Bonna Gains MD, MD 10/28/2018 11:03:40 AM This report has been signed electronically. Number of Addenda: 0 Note Initiated On: 10/28/2018 10:02 AM Scope Withdrawal Time: 0 hours 28 minutes 22 seconds  Total Procedure Duration: 0 hours 31 minutes 8 seconds  Estimated Blood Loss: Estimated blood loss: none.      [facility]

## 2018-10-28 NOTE — Op Note (Signed)
[facility] Gastroenterology Patient Name: Belinda Norman Procedure Date: 10/28/2018 10:02 AM MRN: [id number] Account #: [id number] Date of Birth: [birth year] Admit Type: Outpatient Age: 58 Room: [facility] ENDO ROOM 3 Gender: Female Note Status: Finalized Procedure:            Upper GI endoscopy Indications:          Epigastric abdominal pain, Heartburn Providers:            Jerik Falletta B. Bonna Gains MD, MD Referring MD:         Nicoletta Dress (Referring MD) Medicines:            Monitored Anesthesia Care Complications:        No immediate complications. Procedure:            Pre-Anesthesia Assessment:                       - Prior to the procedure, a History and Physical was                        performed, and patient medications, allergies and                        sensitivities were reviewed. The patient's tolerance of                        previous anesthesia was reviewed.                       - The risks and benefits of the procedure and the                        sedation options and risks were discussed with the                        patient. All questions were answered and informed                        consent was obtained.                       - Patient identification and proposed procedure were                        verified prior to the procedure by the physician, the                        nurse, the anesthesiologist, the anesthetist and the                        technician. The procedure was verified in the procedure                        room.                       - ASA Grade Assessment: II - A patient with mild                        systemic disease.  After obtaining informed consent, the endoscope was                        passed under direct vision. Throughout the procedure,                        the patient's blood pressure, pulse, and oxygen                        saturations were monitored continuously. The Endoscope                       was introduced through the mouth, and advanced to the                        second part of duodenum. The upper GI endoscopy was                        accomplished with ease. The patient tolerated the                        procedure well. Findings:      The examined esophagus was normal.      Patchy mildly erythematous mucosa without bleeding was found in the       gastric antrum. Biopsies were taken with a cold forceps for histology.       Biopsies were obtained in the gastric body, at the incisura and in the       gastric antrum with cold forceps for histology.      The duodenal bulb, second portion of the duodenum and examined duodenum       were normal. Impression:           - Normal esophagus.                       - Erythematous mucosa in the antrum. Biopsied.                       - Normal duodenal bulb, second portion of the duodenum                        and examined duodenum.                       - Biopsies were obtained in the gastric body, at the                        incisura and in the gastric antrum. Recommendation:       - Await pathology results.                       - Discharge patient to home (with escort).                       - Advance diet as tolerated.                       - Continue present medications.                       - Patient has a contact number available for  emergencies. The signs and symptoms of potential                        delayed complications were discussed with the patient.                        Return to normal activities tomorrow. Written discharge                        instructions were provided to the patient.                       - Discharge patient to home (with escort).                       - The findings and recommendations were discussed with                        the patient.                       - The findings and recommendations were discussed with                        the  patient's family. Procedure Code(s):    --- Professional ---                       [phone], Esophagogastroduodenoscopy, flexible, transoral;                        with biopsy, single or multiple Diagnosis Code(s):    --- Professional ---                       K31.89, Other diseases of stomach and duodenum                       R10.13, Epigastric pain                       R12, Heartburn CPT copyright 2019 American Medical Association. All rights reserved. The codes documented in this report are preliminary and upon coder review may  be revised to meet current compliance requirements.  [name], MD Margretta Sidle B. Bonna Gains MD, MD 10/28/2018 10:18:50 AM This report has been signed electronically. Number of Addenda: 0 Note Initiated On: 10/28/2018 10:02 AM Estimated Blood Loss: Estimated blood loss: none.      [facility]

## 2018-10-28 NOTE — Transfer of Care (Signed)
Immediate Anesthesia Transfer of Care Note  Patient: LOTTE COOLING  Procedure(s) Performed: COLONOSCOPY WITH PROPOFOL (N/A ) ESOPHAGOGASTRODUODENOSCOPY (EGD) WITH PROPOFOL (N/A )  Patient Location: Endoscopy Unit  Anesthesia Type:General  Level of Consciousness: drowsy, patient cooperative and responds to stimulation  Airway & Oxygen Therapy: Patient Spontanous Breathing and Patient connected to face mask oxygen  Post-op Assessment: Report given to RN and Post -op Vital signs reviewed and stable  Post vital signs: Reviewed and stable  Last Vitals:  Vitals Value Taken Time  BP 97/65 10/28/18 1055  Temp    Pulse 86 10/28/18 1056  Resp 14 10/28/18 1056  SpO2 100 % 10/28/18 1056  Vitals shown include unvalidated device data.  Last Pain:  Vitals:   10/28/18 0900  TempSrc: Oral  PainSc: 2          Complications: No apparent anesthesia complications

## 2018-10-29 NOTE — Anesthesia Postprocedure Evaluation (Signed)
Anesthesia Post Note  Patient: Belinda Norman  Procedure(s) Performed: COLONOSCOPY WITH PROPOFOL (N/A ) ESOPHAGOGASTRODUODENOSCOPY (EGD) WITH PROPOFOL (N/A )  Patient location during evaluation: Endoscopy Anesthesia Type: General Level of consciousness: awake and alert Pain management: pain level controlled Vital Signs Assessment: post-procedure vital signs reviewed and stable Respiratory status: spontaneous breathing, nonlabored ventilation, respiratory function stable and patient connected to nasal cannula oxygen Cardiovascular status: blood pressure returned to baseline and stable Postop Assessment: no apparent nausea or vomiting Anesthetic complications: no     Last Vitals:  Vitals:   10/28/18 1055 10/28/18 1115  BP:  119/80  Pulse:    Temp: (!) 36.1 C   SpO2:      Last Pain:  Vitals:   10/28/18 1107  TempSrc:   PainSc: 0-No pain                 Martha Clan

## 2018-10-31 ENCOUNTER — Encounter: Payer: Self-pay | Admitting: Gastroenterology

## 2018-10-31 LAB — SURGICAL PATHOLOGY

## 2018-11-29 ENCOUNTER — Encounter: Payer: Self-pay | Admitting: *Deleted

## 2018-12-26 ENCOUNTER — Ambulatory Visit: Payer: BC Managed Care – PPO | Admitting: Gastroenterology

## 2019-01-23 ENCOUNTER — Ambulatory Visit: Payer: BC Managed Care – PPO | Admitting: Gastroenterology

## 2019-05-11 ENCOUNTER — Telehealth (INDEPENDENT_AMBULATORY_CARE_PROVIDER_SITE_OTHER): Payer: BC Managed Care – PPO | Admitting: Gastroenterology

## 2019-05-11 ENCOUNTER — Telehealth: Payer: Self-pay

## 2019-05-11 ENCOUNTER — Other Ambulatory Visit: Payer: Self-pay

## 2019-05-11 DIAGNOSIS — K219 Gastro-esophageal reflux disease without esophagitis: Secondary | ICD-10-CM | POA: Diagnosis not present

## 2019-05-11 DIAGNOSIS — R142 Eructation: Secondary | ICD-10-CM | POA: Diagnosis not present

## 2019-05-11 MED ORDER — DICYCLOMINE HCL 10 MG PO CAPS: 10.0000 mg | ORAL_CAPSULE | Freq: Three times a day (TID) | ORAL | 0 refills | Status: DC | PRN

## 2019-05-11 NOTE — Progress Notes (Signed)
[name], MD [address]  [place]  [place],  [address]  Main: [phone]  Fax: [phone]   Primary Care Physician: Nicoletta Dress, MD  Virtual Visit via Video Note  I connected with patient on 05/11/19 at  2:15 PM EDT by video (using [email or web address]) and verified that I am speaking with the correct person using two identifiers.   I discussed the limitations, risks, security and privacy concerns of performing an evaluation and management service by video and the availability of in person appointments. I also discussed with the patient that there may be a patient responsible charge related to this service. The patient expressed understanding and agreed to proceed.  Location of Patient: Home Location of Provider: Home Persons involved: Patient and provider only (Nursing staff checked in patient via phone but were not physically involved in the video interaction - see their notes)   History of Present Illness: CC: Belching  HPI: Belinda Norman is a 59 y.o. female here for follow-up of chronic GI symptoms with previous extensive work-up.  Underwent EGD and colonoscopy in October 2020.  EGD for epigastric pain showed gastric erythema and otherwise normal.  Colonoscopy showed 6 mm sigmoid colon polyp, and nonbleeding internal hemorrhoids, otherwise normal.  Pathology showed mild chronic gastritis, and polypoid benign colonic mucosa.  Patient was given Pepcid on last visit, but has been taking Prilosec 20 mg daily instead over-the-counter.  Complains of frequent belching and gas.  Has not instituted a low FODMAP diet yet.  Also reporting heartburn despite once daily Prilosec symptoms with waking up at night heartburn 2-3 times a week.  No blood in stool.  Does have days with constipation and some loose bowel movements  Previous history:  Last GI evaluation was in December 2019 at [facility].  This note reports 20+ years of dyspepsia with epigastric pain.  Their  impression was "female with a 20+ year history of dyspepsia and a negative work-up to date that has included several EGDs, colonoscopy, breath testing for SIBO and treatment for H pylori.  She has been refractory to twice daily therapy.  This is most consistent with functional dyspepsia which we discussed at length."  Patient states she was not satisfied after this visit at [facility] and feels her symptoms have worsened. Addendum: Further records obtained today 09/29/2018 EGD, July 2018 with Dr. Earlean Shawl Normal esophagus, portal hypertensive gastropathy.  Biopsies obtained  Colonoscopy July 2018 for colorectal cancer screening.  Reports no neoplasia, normal ileoscopy, normal colonoscopy examination.  Internal hemorrhoids EGD 2017: Impression:    - Two plaques at the gastroesophageal junction. Biopsied.           - Erythematous mucosa in the antrum. Biopsied.           - The examination was otherwise normal.  I do not have the pathology report from this procedure  EGD 2015: Indications "the patient is a 59 year old white female with recent partial therapy for Helicobacter, based on positive serologies and symptoms of nausea and epigastric pain.  She had difficulty tolerating antibiotic therapy which was stopped prematurely.  Nausea and epigastric pain continue."  Impression was antritis, mild, nonerosive with CLO biopsies performed.  Normal esophagus with no esophageal varices.  Pathology report not available  Also has history of hepatitis C which was treated in 2014 and she sees Dr. Angela Adam every 6 months and states she has been in remission.  Last lab showing HCV RNA that I have available was in 2015 and  it was undetectable.  Current Outpatient Medications  Medication Sig Dispense Refill  . acetaminophen (TYLENOL) 500 MG tablet Take 500 mg by mouth every 6 (six) hours as needed for moderate pain.    Marland Kitchen acyclovir (ZOVIRAX) 400 MG tablet Take 400 mg by mouth 5 (five) times  daily as needed.    Marland Kitchen albuterol (VENTOLIN HFA) 108 (90 Base) MCG/ACT inhaler Inhale 2 puffs into the lungs every 6 (six) hours as needed for wheezing.    Marland Kitchen azelastine (ASTELIN) 0.1 % nasal spray Place 2 sprays into the nose 2 (two) times daily.    . bisacodyl (BISACODYL) 5 MG EC tablet Take 2 tablets (10mg ) by mouth the day before your procedure between 1pm-3pm. (Patient not taking: Reported on 10/28/2018) 2 tablet 0  . Cholecalciferol (VITAMIN D3) 1000 units CAPS Take 2,000 Units by mouth daily.     . citalopram (CELEXA) 10 MG tablet Take 10 mg by mouth daily.    . clonazePAM (KLONOPIN) 1 MG tablet Take 1 mg by mouth daily as needed for anxiety.     . clonazePAM (KLONOPIN) 1 MG tablet Take 1 mg by mouth daily as needed for anxiety.    . DULoxetine (CYMBALTA) 30 MG capsule Take 90 mg by mouth daily.    . famotidine (PEPCID) 20 MG tablet Take 1 tablet (20 mg total) by mouth 2 (two) times daily. 60 tablet 1  . fexofenadine (ALLEGRA) 180 MG tablet Take 180 mg by mouth daily.    . fluticasone (FLONASE) 50 MCG/ACT nasal spray Place 2 sprays into both nostrils daily as needed for allergies or rhinitis.    . hyoscyamine (ANASPAZ) 0.125 MG TBDP disintergrating tablet Place 0.125 mg under the tongue every 4 (four) hours as needed.     No current facility-administered medications for this visit.    Allergies as of 05/11/2019 - Review Complete 10/28/2018  Allergen Reaction Noted  . Percocet [oxycodone-acetaminophen] Nausea And Vomiting 06/10/2018  . Zofran [ondansetron hcl] Other (See Comments) 08/31/2016  . Codeine Nausea And Vomiting and Nausea Only 11/18/2011  . Epinephrine Other (See Comments) 02/11/2016  . Morphine and related Other (See Comments) 06/10/2018    Review of Systems:    All systems reviewed and negative except where noted in HPI.   Observations/Objective:  Labs: CMP     Component Value Date/Time   NA 138 03/30/2018 1139   K 3.9 03/30/2018 1139   CL 103 03/30/2018 1139    CO2 27 03/30/2018 1139   GLUCOSE 100 (H) 03/30/2018 1139   BUN 15 03/30/2018 1139   CREATININE 0.80 06/24/2018 0631   CALCIUM 9.2 03/30/2018 1139   PROT 7.1 03/30/2018 1139   ALBUMIN 4.4 03/30/2018 1139   AST 17 03/30/2018 1139   ALT 20 03/30/2018 1139   ALKPHOS 56 03/30/2018 1139   BILITOT 0.5 03/30/2018 1139   GFRNONAA >60 06/24/2018 0631   GFRAA >60 06/24/2018 0631   Lab Results  Component Value Date   WBC 4.6 03/30/2018   HGB 14.9 03/30/2018   HCT 43.8 03/30/2018   MCV 92.6 03/30/2018   PLT 162 03/30/2018    Imaging Studies: No results found.  Assessment and Plan:   Belinda Norman is a 59 y.o. y/o female here for follow-up of chronic abdominal symptoms with increased reflux recently  Assessment and Plan: Discontinue Pepcid increase Prilosec to 20 mg twice daily, patient is taking this over-the-counter and does not need a prescription.  Proper use, 30 to 45 minutes before breakfast and dinner  discussed  Patient encouraged not to eat 3 hours before bedtime  Patient has never had a pH monitoring study, and given her ongoing symptoms and other work-up as above being unrevealing, it would help determine if she is having acid reflux, and if ongoing PPI use would be helpful or not  Start Metamucil daily to help regulate bowel movements as well  Low FODMAP diet discussed again and encouraged  Follow Up Instructions:    I discussed the assessment and treatment plan with the patient. The patient was provided an opportunity to ask questions and all were answered. The patient agreed with the plan and demonstrated an understanding of the instructions.   The patient was advised to call back or seek an in-person evaluation if the symptoms worsen or if the condition fails to improve as anticipated.  I provided 15 minutes of face-to-face time via video software during this encounter. Additional time was spent in reviewing patient's chart, placing orders etc.   Virgel Manifold, MD  Speech recognition software was used to dictate this note.

## 2019-05-11 NOTE — Telephone Encounter (Signed)
Patient's referral to [facility] GI motility lab was faxed. Check and see if it had been scheduled.

## 2019-05-18 NOTE — Telephone Encounter (Signed)
Called [facility] and they stated that they had received patient's referral and that it takes about 2-3 weeks for them to schedule.

## 2019-05-29 ENCOUNTER — Telehealth: Payer: Self-pay | Admitting: Gastroenterology

## 2019-05-29 IMAGING — CR DG CHEST 2V
2 series · 2 of 2 positions shown · non-contrast
Comparison: 08/29/2008

CLINICAL DATA: Pt states she has been sick x 1 week with sore
throat, Mollberg cough, chest and sinus drainage, nasal congestion,
fatigue. Sore neck glands. Hx rib fractures on left and left
clavicle 7989. Double mastectomy 4 years ago with reconstruction

EXAM:
CHEST - 2 VIEW

[chest pa]
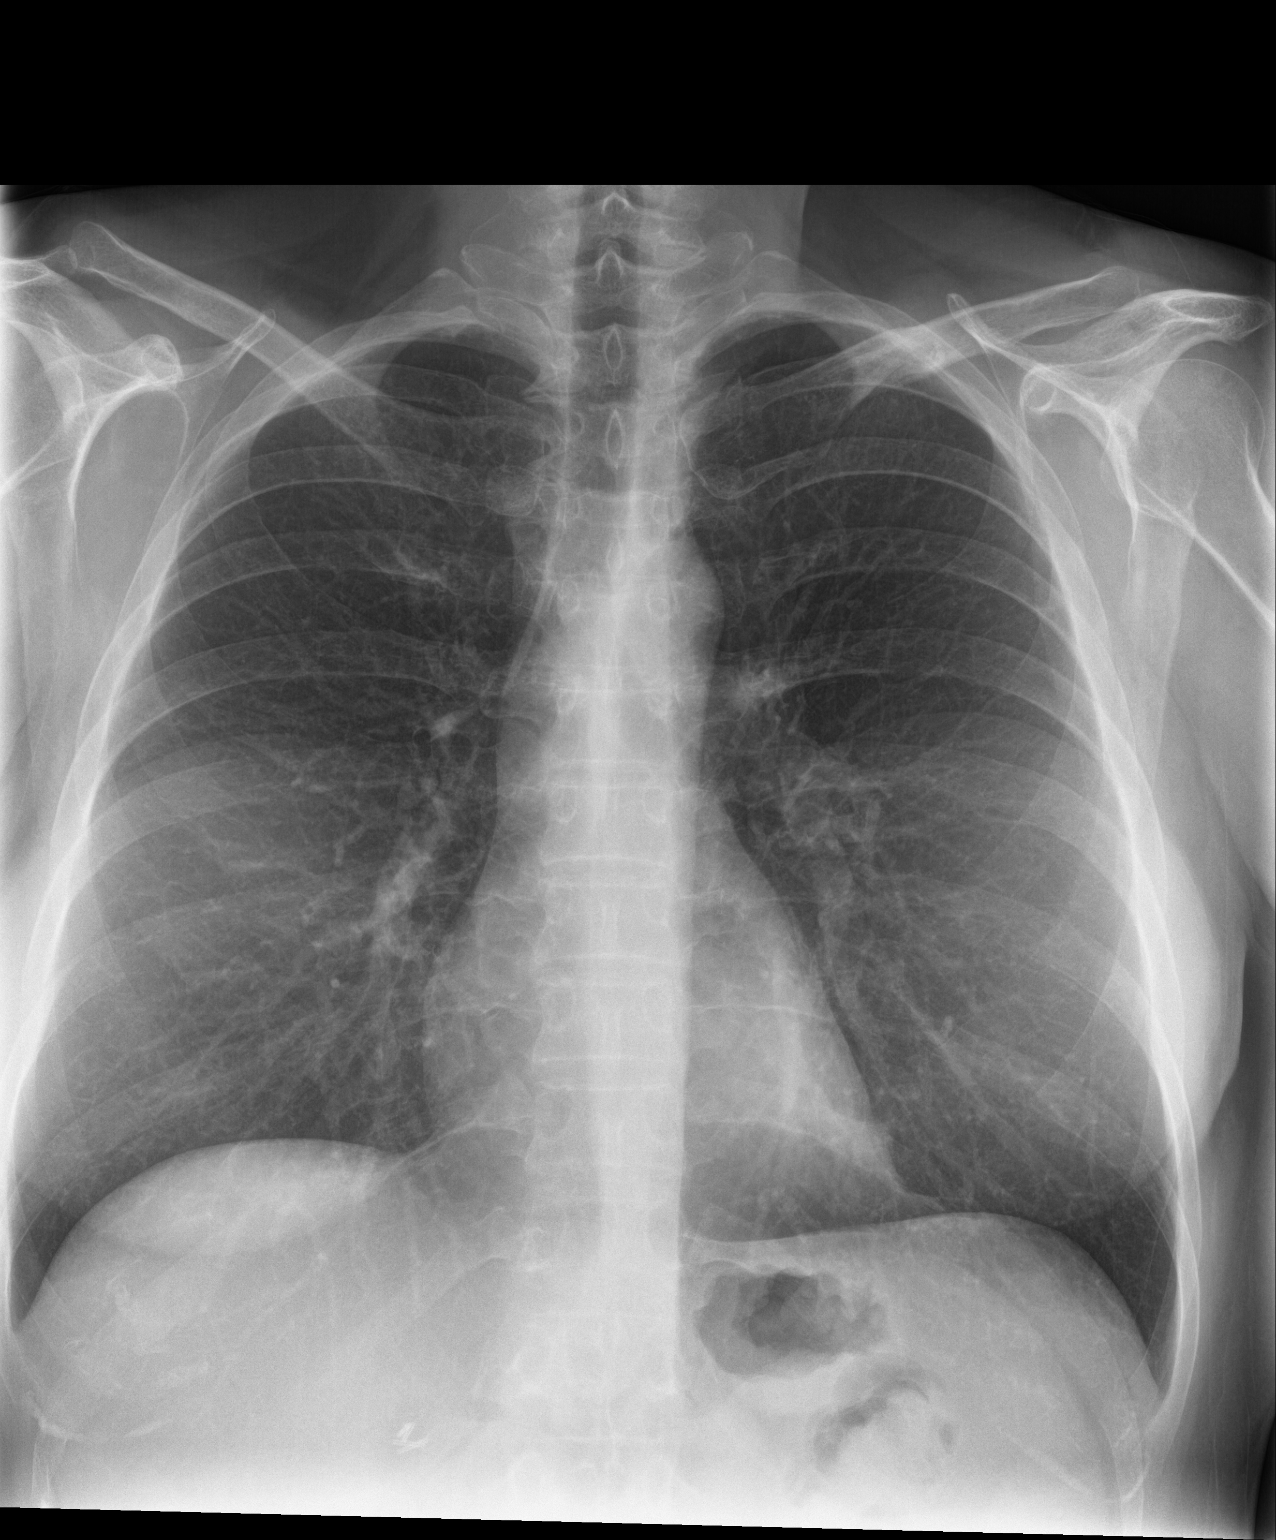

[chest lat]
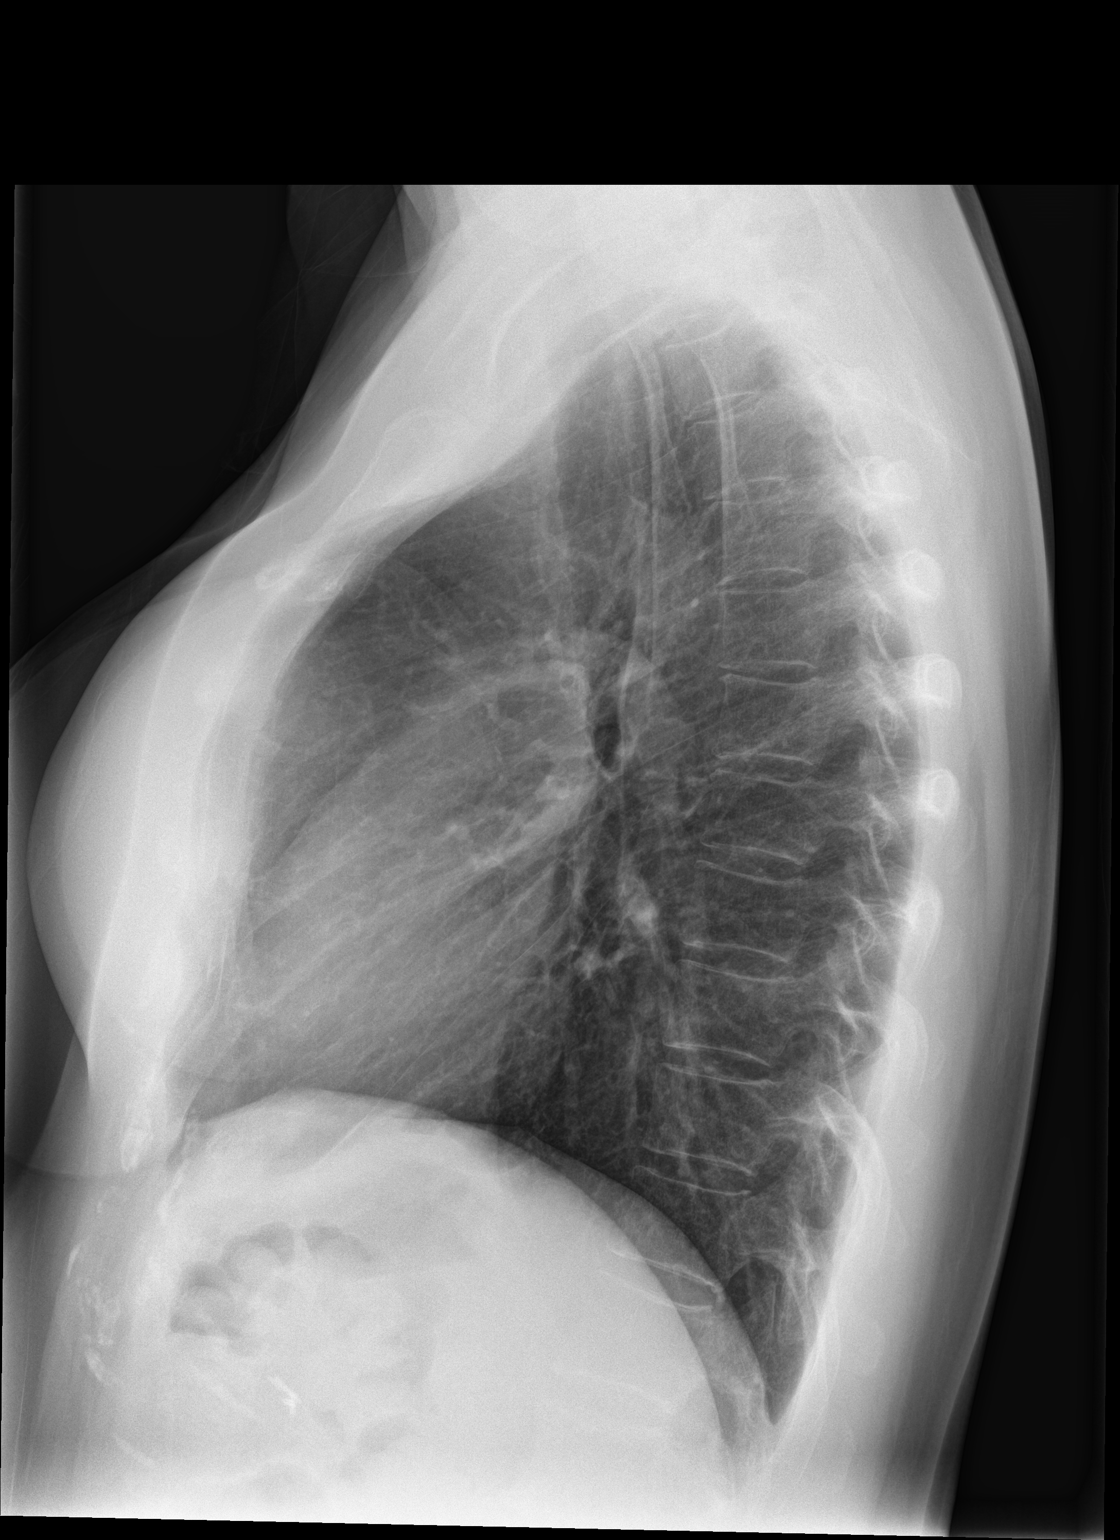

[2 of 2 positions shown; findings below may reference images not displayed]

FINDINGS: The lungs appear clear.  Cardiac and mediastinal contours normal.

No pleural effusion identified.

Old healed left mid clavicular deformity. Bilateral breast implants
noted.
IMPRESSION: 1.  No active cardiopulmonary disease is radiographically apparent.
2. Old left mid clavicular fracture, healed.

## 2019-05-29 NOTE — Telephone Encounter (Signed)
Prilosec 40 mg  x2 a day  [facility] on [address]  90 day refill

## 2019-05-31 ENCOUNTER — Other Ambulatory Visit: Payer: Self-pay

## 2019-06-05 NOTE — Telephone Encounter (Signed)
Patient finally had an appointment scheduled for her [facility] on 06/22/2019.

## 2019-06-19 ENCOUNTER — Other Ambulatory Visit: Payer: Self-pay

## 2019-06-19 MED ORDER — OMEPRAZOLE 40 MG PO CPDR: 40.0000 mg | DELAYED_RELEASE_CAPSULE | Freq: Two times a day (BID) | ORAL | 3 refills | Status: DC

## 2019-07-10 ENCOUNTER — Telehealth: Payer: Self-pay

## 2019-07-10 NOTE — Telephone Encounter (Signed)
[facility] called from the GI motility unit they state they had to stop patient pH monitoring study this morning because patient kept saying to stop the procedure it was to painful.

## 2019-07-25 ENCOUNTER — Other Ambulatory Visit: Payer: Self-pay | Admitting: Physical Medicine and Rehabilitation

## 2019-07-25 DIAGNOSIS — G8929 Other chronic pain: Secondary | ICD-10-CM

## 2019-08-09 ENCOUNTER — Ambulatory Visit: Payer: BC Managed Care – PPO

## 2019-08-10 ENCOUNTER — Ambulatory Visit
Admission: RE | Admit: 2019-08-10 | Discharge: 2019-08-10 | Disposition: A | Discharge status: Home / Self Care | Payer: BC Managed Care – PPO | Source: Ambulatory Visit | Attending: Physical Medicine and Rehabilitation | Admitting: Physical Medicine and Rehabilitation

## 2019-08-10 ENCOUNTER — Other Ambulatory Visit: Payer: Self-pay

## 2019-08-10 DIAGNOSIS — M5441 Lumbago with sciatica, right side: Secondary | ICD-10-CM | POA: Insufficient documentation

## 2019-08-10 DIAGNOSIS — G8929 Other chronic pain: Secondary | ICD-10-CM | POA: Insufficient documentation

## 2020-01-13 ENCOUNTER — Other Ambulatory Visit: Payer: Self-pay

## 2020-01-13 ENCOUNTER — Ambulatory Visit
Admission: EM | Admit: 2020-01-13 | Discharge: 2020-01-13 | Disposition: A | Discharge status: Home / Self Care | Payer: BC Managed Care – PPO | Attending: Family Medicine | Admitting: Family Medicine

## 2020-01-13 ENCOUNTER — Encounter: Payer: Self-pay | Admitting: Emergency Medicine

## 2020-01-13 DIAGNOSIS — J0100 Acute maxillary sinusitis, unspecified: Secondary | ICD-10-CM | POA: Diagnosis not present

## 2020-01-13 DIAGNOSIS — Z20822 Contact with and (suspected) exposure to covid-19: Secondary | ICD-10-CM | POA: Diagnosis not present

## 2020-01-13 MED ORDER — AMOXICILLIN-POT CLAVULANATE 875-125 MG PO TABS: 1.0000 | ORAL_TABLET | Freq: Two times a day (BID) | ORAL | 0 refills | Status: DC

## 2020-01-13 MED ORDER — BENZONATATE 200 MG PO CAPS: 200.0000 mg | ORAL_CAPSULE | Freq: Three times a day (TID) | ORAL | 0 refills | Status: DC | PRN

## 2020-01-13 NOTE — ED Provider Notes (Signed)
[facility]    CSN: [id number] Arrival date & time: 01/13/20  1036  History   Chief Complaint Chief Complaint  Patient presents with  . Nasal Congestion    [phone]  waiting in car   . Cough  . Facial Pain   HPI  60 year old female presents with the above complaints.  Patient reports that she has had symptoms for the past 3 days.  She reports nasal congestion, sinus pain and pressure particular in the maxillary region, and associated cough.  She has been taking Zyrtec and Mucinex D without relief.  Patient states that she has a history of sinusitis and this feels similar to past bouts with sinusitis.  No fever.  No known inciting factor.  Her daughter has recently been sick but there is no reported Covid exposure.  Pain 7/10 in severity.  No other complaints at this time.  Past Medical History:  Diagnosis Date  . Anxiety   . Arthritis   . Cancer [facility])    breast cancer. had bilateral mastectomies  . DCIS (ductal carcinoma in situ)   . Depression   . GERD (gastroesophageal reflux disease)   . Hepatitis C    cured in 2014  . HSV (herpes simplex virus) infection   . IBS (irritable bowel syndrome)     Patient Active Problem List   Diagnosis Date Noted  . Abdominal pain, epigastric   . Heartburn   . Screening for colon cancer   . Polyp of colon   . 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 Stomach irritation 06/07/2017  . Mastalgia 08/14/2016  . History of mastectomy, right 08/10/2016  . Primary osteoarthritis of right knee 03/24/2016  . Muscle spasm 11/12/2015  . Breast cancer ([place]) 09/27/2013  . Osteoporosis 08/30/2013  . 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 SI (sacroiliac) pain 12/21/2012  . Lumbosacral facet joint syndrome 12/21/2012  . Back pain 12/18/2012  . Hemorrhoids 12/18/2012  . Thrombocytopenia ([place]) 12/18/2012  . GERD (gastroesophageal reflux disease) 12/05/2011  . Depression 12/05/2011  . Hepatitis C 11/18/2011  . Disorder of  sacrum 09/15/2011    Past Surgical History:  Procedure Laterality Date  . ABDOMINAL HYSTERECTOMY    . arthroscopic knee surgery     x2  . BREAST SURGERY     mastectomies then reconstructive surgery times 2.  . CESAREAN SECTION    . CHOLECYSTECTOMY  10/1998  . COLONOSCOPY WITH PROPOFOL N/A 10/28/2018   Procedure: COLONOSCOPY WITH PROPOFOL;  Surgeon: Virgel Manifold, MD;  Location: [facility];  Service: Endoscopy;  Laterality: N/A;  . ESOPHAGOGASTRODUODENOSCOPY (EGD) WITH PROPOFOL N/A 10/28/2018   Procedure: ESOPHAGOGASTRODUODENOSCOPY (EGD) WITH PROPOFOL;  Surgeon: Virgel Manifold, MD;  Location: [facility] END[facility];  Service: Endoscopy;  Laterality: N/A;  . FOOT SURGERY    . KNEE ARTHROPLASTY Right 06/22/2018   Procedure: COMPUTER ASSISTED TOTAL KNEE ARTHROPLASTY RIGHT - Will need an RNFA;  Surgeon: Dereck Leep, MD;  Location: [facility] ORS;  Service: Orthopedics;  Laterality: Right;  . knee surgery with nerve repair    . MASTECTOMY Bilateral   . MASTECTOMY, MODIFIED RADICAL W/RECONSTRUCTION    . TONSILLECTOMY    . TOTAL ABDOMINAL HYSTERECTOMY W/ BILATERAL SALPINGOOPHORECTOMY  1999    OB History   No obstetric history on file.      Home Medications    Prior to Admission medications   Medication Sig Start Date End Date Taking? Authorizing Provider  acetaminophen (TYLENOL) 500 MG tablet Take 500 mg by mouth every 6 (  six) hours as needed for moderate pain.   Yes [provider]  acyclovir (ZOVIRAX) 400 MG tablet Take 400 mg by mouth 5 (five) times daily as needed.   Yes [provider]  albuterol (VENTOLIN HFA) 108 (90 Base) MCG/ACT inhaler Inhale 2 puffs into the lungs every 6 (six) hours as needed for wheezing.   Yes [provider]  amoxicillin-clavulanate (AUGMENTIN) 875-125 MG tablet Take 1 tablet by mouth 2 (two) times daily. 01/13/20  Yes Joella Saefong G, DO  benzonatate (TESSALON) 200 MG capsule Take 1 capsule (200 mg total) by mouth 3 (three)  times daily as needed for cough. 01/13/20  Yes Ellina Sivertsen G, DO  bisacodyl (BISACODYL) 5 MG EC tablet Take 2 tablets (10mg ) by mouth the day before your procedure between 1pm-3pm. 09/27/18  Yes Tahiliani, 09/29/18, MD  Cholecalciferol (VITAMIN D3) 1000 units CAPS Take 2,000 Units by mouth daily.    Yes [provider]  citalopram (CELEXA) 10 MG tablet Take 10 mg by mouth daily.   Yes [provider]  clonazePAM (KLONOPIN) 1 MG tablet Take 1 mg by mouth daily as needed for anxiety.    Yes [provider]  clonazePAM (KLONOPIN) 1 MG tablet Take 1 mg by mouth daily as needed for anxiety.   Yes [provider]  dicyclomine (BENTYL) 10 MG capsule Take 1 capsule (10 mg total) by mouth 3 (three) times daily as needed for up to 30 doses for spasms. 05/11/19  Yes 05/13/19 B, MD  DULoxetine (CYMBALTA) 30 MG capsule Take 90 mg by mouth daily. 12/15/15  Yes [provider]  fexofenadine (ALLEGRA) 180 MG tablet Take 180 mg by mouth daily.   Yes [provider]  fluticasone (FLONASE) 50 MCG/ACT nasal spray Place 2 sprays into both nostrils daily as needed for allergies or rhinitis.   Yes [provider]  omeprazole (PRILOSEC) 40 MG capsule Take 1 capsule (40 mg total) by mouth in the morning and at bedtime. 06/19/19  Yes 08/19/19, MD  azelastine (ASTELIN) 0.1 % nasal spray Place 2 sprays into the nose 2 (two) times daily. 04/11/18 04/11/19  [provider]    Family History Family History  Problem Relation Age of Onset  . Hypertension Father   . Liver cancer Mother        bile duct cancer  . Alcohol abuse Mother   . Diabetes Brother   . Breast cancer Paternal Aunt   . Throat cancer Paternal Grandmother   . Prostate cancer Paternal Grandfather   . Colon cancer Neg Hx     Social History Social History   Tobacco Use  . Smoking status: Never Smoker  . Smokeless tobacco: Never Used  Vaping Use  . Vaping Use: Never  used  Substance Use Topics  . Alcohol use: Yes    Comment: none last 24hrs  . Drug use: Yes    Types: Marijuana     Allergies   Percocet [oxycodone-acetaminophen], Zofran [ondansetron hcl], Codeine, Epinephrine, and Morphine and related   Review of Systems Review of Systems Per HPI  Physical Exam Triage Vital Signs ED Triage Vitals  Enc Vitals Group     BP 01/13/20 1201 (!) 124/93     Pulse Rate 01/13/20 1201 83     Resp 01/13/20 1201 16     Temp 01/13/20 1201 98.6 F (37 C)     Temp Source 01/13/20 1201 Oral     SpO2 01/13/20 1201 100 %  Weight 01/13/20 1118 167 lb (75.8 kg)     Height 01/13/20 1118 5\' 7"  (1.702 m)     Head Circumference --      Peak Flow --      Pain Score 01/13/20 1118 7     Pain Loc --      Pain Edu? --      Excl. in [place]? --    Updated Vital Signs BP (!) 124/93 (BP Location: Left Arm)   Pulse 83   Temp 98.6 F (37 C) (Oral)   Resp 16   Ht 5\' 7"  (1.702 m)   Wt 75.8 kg   SpO2 100%   BMI 26.16 kg/m   Visual Acuity Right Eye Distance:   Left Eye Distance:   Bilateral Distance:    Right Eye Near:   Left Eye Near:    Bilateral Near:     Physical Exam Vitals and nursing note reviewed.  Constitutional:      General: She is not in acute distress.    Appearance: Normal appearance. She is not ill-appearing.  HENT:     Head: Normocephalic and atraumatic.     Right Ear: Tympanic membrane normal.     Left Ear: Tympanic membrane normal.     Nose:     Comments: Bilateral maxillary sinus tenderness to palpation.    Mouth/Throat:     Pharynx: Oropharynx is clear.  Cardiovascular:     Rate and Rhythm: Normal rate and regular rhythm.     Heart sounds: No murmur heard.   Pulmonary:     Effort: Pulmonary effort is normal.     Breath sounds: Normal breath sounds. No wheezing, rhonchi or rales.  Neurological:     Mental Status: She is alert.  Psychiatric:        Mood and Affect: Mood normal.        Behavior: Behavior normal.    UC  Treatments / Results  Labs (all labs ordered are listed, but only abnormal results are displayed) Labs Reviewed  SARS CORONAVIRUS 2 (TAT 6-24 HRS)    EKG   Radiology No results found.  Procedures Procedures (including critical care time)  Medications Ordered in UC Medications - No data to display  Initial Impression / Assessment and Plan / UC Course  I have reviewed the triage vital signs and the nursing notes.  Pertinent labs & imaging results that were available during my care of the patient were reviewed by me and considered in my medical decision making (see chart for details).    60 year old female presents with sinusitis.  Possible COVID-19.  Awaiting test result.  Covering empirically with Augmentin while awaiting Covid test result.  Tessalon Perles for cough.  Final Clinical Impressions(s) / UC Diagnoses   Final diagnoses:  Acute maxillary sinusitis, recurrence not specified     Discharge Instructions     Check mychart for COVID test result.  Medication as prescribed.  Take care  Dr. Lacinda Axon    ED Prescriptions    Medication Sig Dispense Auth. Provider   amoxicillin-clavulanate (AUGMENTIN) 875-125 MG tablet Take 1 tablet by mouth 2 (two) times daily. 20 tablet Shanique Aslinger G, DO   benzonatate (TESSALON) 200 MG capsule Take 1 capsule (200 mg total) by mouth 3 (three) times daily as needed for cough. 30 capsule Coral Spikes, DO     PDMP not reviewed this encounter.   Coral Spikes, [place] 01/13/20 1454

## 2020-01-13 NOTE — Discharge Instructions (Signed)
Check mychart for COVID test result.  Medication as prescribed.  Take care  Dr. Adriana Simas

## 2020-01-13 NOTE — ED Triage Notes (Signed)
Patient c/o cough, nasal congestion, facial pain and pressure that started 3 days ago. Started Zyrtec and Mucinex D.

## 2020-01-14 LAB — SARS CORONAVIRUS 2 (TAT 6-24 HRS): SARS Coronavirus 2: NEGATIVE

## 2020-05-28 ENCOUNTER — Other Ambulatory Visit: Payer: Self-pay

## 2020-05-28 ENCOUNTER — Ambulatory Visit (INDEPENDENT_AMBULATORY_CARE_PROVIDER_SITE_OTHER): Payer: BC Managed Care – PPO

## 2020-05-28 ENCOUNTER — Ambulatory Visit
Admission: EM | Admit: 2020-05-28 | Discharge: 2020-05-28 | Disposition: A | Discharge status: Home / Self Care | Payer: BC Managed Care – PPO | Attending: Sports Medicine | Admitting: Sports Medicine

## 2020-05-28 DIAGNOSIS — J069 Acute upper respiratory infection, unspecified: Secondary | ICD-10-CM | POA: Diagnosis not present

## 2020-05-28 DIAGNOSIS — R059 Cough, unspecified: Secondary | ICD-10-CM | POA: Diagnosis not present

## 2020-05-28 DIAGNOSIS — R0981 Nasal congestion: Secondary | ICD-10-CM

## 2020-05-28 DIAGNOSIS — R062 Wheezing: Secondary | ICD-10-CM

## 2020-05-28 MED ORDER — AZITHROMYCIN 250 MG PO TABS: 250.0000 mg | ORAL_TABLET | Freq: Every day | ORAL | 0 refills | Status: DC

## 2020-05-28 MED ORDER — PROMETHAZINE-DM 6.25-15 MG/5ML PO SYRP: 5.0000 mL | ORAL_SOLUTION | Freq: Four times a day (QID) | ORAL | 0 refills | Status: DC | PRN

## 2020-05-28 NOTE — Discharge Instructions (Addendum)
You do have some wheezing on expiration.  I added in a Z-Pak as well as cough medicine for your cough. Educational handouts provided. If symptoms persist please see your primary care provider. If they worsen then please go to the ER. Work note was provided.

## 2020-05-28 NOTE — ED Triage Notes (Addendum)
Pt c/o cough and chest congestion since last Wednesday. States she feels she is continuing to get worse. Pt took at home covid test which was negative.

## 2020-05-28 NOTE — ED Provider Notes (Signed)
[facility]    CSN: [id number] Arrival date & time: 05/28/20  0818      History   Chief Complaint Chief Complaint  Patient presents with  . Cough    HPI Belinda Norman is a 60 y.o. female.   60 year old female who presents for evaluation the above issue.  Normally sees [facility] for ongoing medical needs but could not get an appointment today.  She works as an Web designer at [employer].  She reports being sick now for 6 days.  She reports cough, chest congestion, and mild wheeze.  Symptoms are worsening.  She has been having difficulty sleeping due to the cough.  She took a home COVID test that was negative yesterday.  She called her primary care provider and had a Zoom visit with a provider and was given an inhaler.  She says use it 3 times but it is not helping.  She denies any fever shakes chills.  No nausea vomiting or diarrhea.  No COVID exposure or flu exposure.  She also has been taking some Flonase.  No chest pain or shortness of breath.  No red flag signs or symptoms elicited on history.      Past Medical History:  Diagnosis Date  . Anxiety   . Arthritis   . Cancer [facility])    breast cancer. had bilateral mastectomies  . DCIS (ductal carcinoma in situ)   . Depression   . GERD (gastroesophageal reflux disease)   . Hepatitis C    cured in 2014  . HSV (herpes simplex virus) infection   . IBS (irritable bowel syndrome)     Patient Active Problem List   Diagnosis Date Noted  . Abdominal pain, epigastric   . Heartburn   . Screening for colon cancer   . Polyp of colon   . 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 Stomach irritation 06/07/2017  . Mastalgia 08/14/2016  . History of mastectomy, right 08/10/2016  . Primary osteoarthritis of right knee 03/24/2016  . Muscle spasm 11/12/2015  . Breast cancer ([place]) 09/27/2013  . Osteoporosis 08/30/2013  . 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 SI (sacroiliac) pain 12/21/2012  .  Lumbosacral facet joint syndrome 12/21/2012  . Back pain 12/18/2012  . Hemorrhoids 12/18/2012  . Thrombocytopenia ([place]) 12/18/2012  . GERD (gastroesophageal reflux disease) 12/05/2011  . Depression 12/05/2011  . Hepatitis C 11/18/2011  . Disorder of sacrum 09/15/2011    Past Surgical History:  Procedure Laterality Date  . ABDOMINAL HYSTERECTOMY    . arthroscopic knee surgery     x2  . BREAST SURGERY     mastectomies then reconstructive surgery times 2.  . CESAREAN SECTION    . CHOLECYSTECTOMY  10/1998  . COLONOSCOPY WITH PROPOFOL N/A 10/28/2018   Procedure: COLONOSCOPY WITH PROPOFOL;  Surgeon: Virgel Manifold, MD;  Location: [facility] END[facility]COPY;  Service: Endoscopy;  Laterality: N/A;  . ESOPHAGOGASTRODUODENOSCOPY (EGD) WITH PROPOFOL N/A 10/28/2018   Procedure: ESOPHAGOGASTRODUODENOSCOPY (EGD) WITH PROPOFOL;  Surgeon: Virgel Manifold, MD;  Location: [facility] ENDOSCOPY;  Service: Endoscopy;  Laterality: N/A;  . FOOT SURGERY    . KNEE ARTHROPLASTY Right 06/22/2018   Procedure: COMPUTER ASSISTED TOTAL KNEE ARTHROPLASTY RIGHT - Will need an RNFA;  Surgeon: Dereck Leep, MD;  Location: [facility] ORS;  Service: Orthopedics;  Laterality: Right;  . knee surgery with nerve repair    . MASTECTOMY Bilateral   . MASTECTOMY, MODIFIED RADICAL W/RECONSTRUCTION    . TONSILLECTOMY    .  TOTAL ABDOMINAL HYSTERECTOMY W/ BILATERAL SALPINGOOPHORECTOMY  1999    OB History   No obstetric history on file.      Home Medications    Prior to Admission medications   Medication Sig Start Date End Date Taking? Authorizing Provider  azithromycin (ZITHROMAX) 250 MG tablet Take 1 tablet (250 mg total) by mouth daily. Take first 2 tablets together, then 1 every day until finished. 05/28/20  Yes Verda Cumins, MD  Cholecalciferol (VITAMIN D3) 1000 units CAPS Take 2,000 Units by mouth daily.    Yes [provider]  citalopram (CELEXA) 10 MG tablet Take 10 mg by mouth daily.   Yes [provider]  desipramine (NOPRAMIN) 10 MG tablet Take 10 mg by mouth at bedtime. 02/26/20  Yes [provider]  promethazine-dextromethorphan (PROMETHAZINE-DM) 6.25-15 MG/5ML syrup Take 5 mLs by mouth 4 (four) times daily as needed for cough. 05/28/20  Yes Verda Cumins, MD  acetaminophen (TYLENOL) 500 MG tablet Take 500 mg by mouth every 6 (six) hours as needed for moderate pain.    [provider]  acyclovir (ZOVIRAX) 400 MG tablet Take 400 mg by mouth 5 (five) times daily as needed.    [provider]  albuterol (VENTOLIN HFA) 108 (90 Base) MCG/ACT inhaler Inhale 2 puffs into the lungs every 6 (six) hours as needed for wheezing.    [provider]  amoxicillin-clavulanate (AUGMENTIN) 875-125 MG tablet Take 1 tablet by mouth 2 (two) times daily. 01/13/20   Coral Spikes, DO  azelastine (ASTELIN) 0.1 % nasal spray Place 2 sprays into the nose 2 (two) times daily. 04/11/18 04/11/19  [provider]  benzonatate (TESSALON) 200 MG capsule Take 1 capsule (200 mg total) by mouth 3 (three) times daily as needed for cough. 01/13/20   Coral Spikes, DO  bisacodyl (BISACODYL) 5 MG EC tablet Take 2 tablets (10mg ) by mouth the day before your procedure between 1pm-3pm. 09/27/18   Virgel Manifold, MD  clonazePAM (KLONOPIN) 1 MG tablet Take 1 mg by mouth daily as needed for anxiety.     [provider]  clonazePAM (KLONOPIN) 1 MG tablet Take 1 mg by mouth daily as needed for anxiety.    [provider]  dicyclomine (BENTYL) 10 MG capsule Take 1 capsule (10 mg total) by mouth 3 (three) times daily as needed for up to 30 doses for spasms. 05/11/19   Virgel Manifold, MD  DULoxetine (CYMBALTA) 30 MG capsule Take 90 mg by mouth daily. 12/15/15   [provider]  fexofenadine (ALLEGRA) 180 MG tablet Take 180 mg by mouth daily.    [provider]  fluticasone (FLONASE) 50 MCG/ACT nasal spray Place 2 sprays into both nostrils daily as needed for  allergies or rhinitis.    [provider]  omeprazole (PRILOSEC) 40 MG capsule Take 1 capsule (40 mg total) by mouth in the morning and at bedtime. 06/19/19   Virgel Manifold, MD    Family History Family History  Problem Relation Age of Onset  . Hypertension Father   . Liver cancer Mother        bile duct cancer  . Alcohol abuse Mother   . Diabetes Brother   . Breast cancer Paternal Aunt   . Throat cancer Paternal Grandmother   . Prostate cancer Paternal Grandfather   . Colon cancer Neg Hx     Social History Social History   Tobacco Use  . Smoking status: Never Smoker  . Smokeless  tobacco: Never Used  Vaping Use  . Vaping Use: Never used  Substance Use Topics  . Alcohol use: Yes    Comment: none last 24hrs  . Drug use: Yes    Types: Marijuana     Allergies   Percocet [oxycodone-acetaminophen], Zofran [ondansetron hcl], Codeine, Epinephrine, and Morphine and related   Review of Systems Review of Systems  Constitutional: Negative for activity change, appetite change, chills, diaphoresis, fatigue and fever.  HENT: Positive for congestion and sore throat. Negative for ear pain, postnasal drip, rhinorrhea, sinus pressure, sinus pain and sneezing.   Eyes: Negative for pain.  Respiratory: Positive for wheezing. Negative for cough, chest tightness, shortness of breath and stridor.   Cardiovascular: Negative for chest pain and palpitations.  Gastrointestinal: Negative for abdominal pain, diarrhea, nausea and vomiting.  Genitourinary: Negative for dysuria.  Musculoskeletal: Negative for back pain, myalgias and neck pain.  Skin: Negative for color change, pallor, rash and wound.  Neurological: Negative for dizziness, light-headedness and headaches.  All other systems reviewed and are negative.    Physical Exam Triage Vital Signs ED Triage Vitals  Enc Vitals Group     BP 05/28/20 0828 118/78     Pulse Rate 05/28/20 0828 80     Resp 05/28/20 0828 16      Temp 05/28/20 0828 98.3 F (36.8 C)     Temp Source 05/28/20 0828 Oral     SpO2 05/28/20 0828 100 %     Weight 05/28/20 0826 165 lb (74.8 kg)     Height --      Head Circumference --      Peak Flow --      Pain Score 05/28/20 0826 3     Pain Loc --      Pain Edu? --      Excl. in [place]? --    No data found.  Updated Vital Signs BP 118/78 (BP Location: Right Arm)   Pulse 80   Temp 98.3 F (36.8 C) (Oral)   Resp 16   Wt 74.8 kg   SpO2 100%   BMI 25.84 kg/m   Visual Acuity Right Eye Distance:   Left Eye Distance:   Bilateral Distance:    Right Eye Near:   Left Eye Near:    Bilateral Near:     Physical Exam Vitals and nursing note reviewed.  Constitutional:      General: She is not in acute distress.    Appearance: Normal appearance. She is not ill-appearing, toxic-appearing or diaphoretic.  HENT:     Head: Normocephalic and atraumatic.     Right Ear: Tympanic membrane normal.     Left Ear: Tympanic membrane normal.     Nose: Congestion present. No rhinorrhea.     Mouth/Throat:     Mouth: Mucous membranes are moist.     Pharynx: No oropharyngeal exudate or posterior oropharyngeal erythema.  Eyes:     General: No scleral icterus.       Right eye: No discharge.        Left eye: No discharge.     Extraocular Movements: Extraocular movements intact.     Conjunctiva/sclera: Conjunctivae normal.     Pupils: Pupils are equal, round, and reactive to light.  Cardiovascular:     Rate and Rhythm: Normal rate and regular rhythm.     Pulses: Normal pulses.     Heart sounds: Normal heart sounds. No murmur heard. No friction rub. No gallop.   Pulmonary:  Effort: Pulmonary effort is normal.     Breath sounds: No stridor. Wheezing present. No decreased breath sounds, rhonchi or rales.     Comments: Mild wheeze heard throughout all lung fields. Musculoskeletal:     Cervical back: Normal range of motion and neck supple.  Skin:    General: Skin is warm and dry.      Capillary Refill: Capillary refill takes less than 2 seconds.     Findings: No bruising, erythema, lesion or rash.  Neurological:     General: No focal deficit present.     Mental Status: She is alert and oriented to person, place, and time.  Psychiatric:        Mood and Affect: Mood normal.      UC Treatments / Results  Labs (all labs ordered are listed, but only abnormal results are displayed) Labs Reviewed - No data to display  EKG   Radiology DG Chest 2 View  Result Date: 05/28/2020 CLINICAL DATA:  Cough and congestion EXAM: CHEST - 2 VIEW COMPARISON:  November 10, 2017 FINDINGS: Lungs are clear. Heart size and pulmonary vascularity are normal. No adenopathy. Evidence of prior healed fracture medial left clavicle. IMPRESSION: No edema or airspace opacity.  Heart size within normal limits. Electronically Signed   By: Lowella Grip III M.D.   On: 05/28/2020 08:50    Procedures Procedures (including critical care time)  Medications Ordered in UC Medications - No data to display  Initial Impression / Assessment and Plan / UC Course  I have reviewed the triage vital signs and the nursing notes.  Pertinent labs & imaging results that were available during my care of the patient were reviewed by me and considered in my medical decision making (see chart for details).   Clinical impression: 6 days of cough and congestion.  On exam she does have some mild wheezing with expiration.  She is satting 100% on room air.  Vital signs are otherwise stable.  Other than the wheeze physical exam is reassuring.  No fevers.  Negative COVID test at home.  Treatment plan: 1.  The findings and treat plan were discussed in detail with the patient.  Patient was in agreement. 2.  I do agree with the inhaler for the wheeze.  Given her cough and inability to sleep at night I will get a go ahead and add and a Z-Pak for atypical bacterial coverage.  We will also give her cough medicine.  Both of  these were sent to her pharmacy. 3.  Educational handouts provided. 4.  Plenty of rest, plenty of fluids, Tylenol or Motrin for any fever or discomfort. 5.  If symptoms persist she should contact her primary care provider. 6.  If symptoms worsen she should go to the ER. 7.  I did give her a work note. 8.  She was stable on discharge and she will follow-up here as needed.    Final Clinical Impressions(s) / UC Diagnoses   Final diagnoses:  Viral upper respiratory tract infection  Cough  Wheeze  Nasal congestion     Discharge Instructions     You do have some wheezing on expiration.  I added in a Z-Pak as well as cough medicine for your cough. Educational handouts provided. If symptoms persist please see your primary care provider. If they worsen then please go to the ER. Work note was provided.    ED Prescriptions    Medication Sig Dispense Auth. Provider   azithromycin (ZITHROMAX) 250 MG  tablet Take 1 tablet (250 mg total) by mouth daily. Take first 2 tablets together, then 1 every day until finished. 6 tablet Verda Cumins, MD   promethazine-dextromethorphan (PROMETHAZINE-DM) 6.25-15 MG/5ML syrup Take 5 mLs by mouth 4 (four) times daily as needed for cough. 180 mL Verda Cumins, MD     PDMP not reviewed this encounter.   Verda Cumins, MD 05/28/20 [phone]

## 2020-09-02 ENCOUNTER — Other Ambulatory Visit: Payer: Self-pay

## 2020-09-02 ENCOUNTER — Ambulatory Visit
Admission: EM | Admit: 2020-09-02 | Discharge: 2020-09-02 | Disposition: A | Discharge status: Home / Self Care | Payer: BC Managed Care – PPO | Attending: Emergency Medicine | Admitting: Emergency Medicine

## 2020-09-02 DIAGNOSIS — Z20822 Contact with and (suspected) exposure to covid-19: Secondary | ICD-10-CM | POA: Insufficient documentation

## 2020-09-02 DIAGNOSIS — J069 Acute upper respiratory infection, unspecified: Secondary | ICD-10-CM | POA: Diagnosis present

## 2020-09-02 LAB — SARS CORONAVIRUS 2 (TAT 6-24 HRS): SARS Coronavirus 2: NEGATIVE

## 2020-09-02 MED ORDER — PREDNISONE 50 MG PO TABS: ORAL_TABLET | ORAL | 0 refills | Status: DC

## 2020-09-02 MED ORDER — IPRATROPIUM BROMIDE 0.06 % NA SOLN: 2.0000 | Freq: Four times a day (QID) | NASAL | 12 refills | Status: DC

## 2020-09-02 MED ORDER — AEROCHAMBER MV MISC: 2 refills | Status: AC

## 2020-09-02 MED ORDER — ALBUTEROL SULFATE HFA 108 (90 BASE) MCG/ACT IN AERS: 2.0000 | INHALATION_SPRAY | Freq: Four times a day (QID) | RESPIRATORY_TRACT | 0 refills | Status: AC | PRN

## 2020-09-02 NOTE — Discharge Instructions (Addendum)
Isolate at home pending the results of your COVID test.  If you test positive then you will have to quarantine for 5 days from the start of your symptoms.  After 5 days you can break quarantine if your symptoms have improved and you have not had a fever for 24 hours without taking Tylenol or ibuprofen.  Use over-the-counter Tylenol and ibuprofen as needed for body aches and fever.  Use the  Promethazine DM cough syrup at nighttime as will make you drowsy.  Use the Atrovent nasal spray, 2 squirts in each nostril every 6 hours, zs needed for congestion and postnasal drip.  Use your Albuterol inhaler with spacer, 2 puffs every 4-6 hours as needed for chest tightness.   Take the Prednisone daily at breakfast for 5 days.  Try using Allegra, Claritin, or Zyrtec to see if this helps your symptoms.   If you develop any increased shortness of breath-especially at rest, you are unable to speak in full sentences, or is a late sign your lips are turning blue you need to go the ER for evaluation.

## 2020-09-02 NOTE — ED Provider Notes (Signed)
[facility]    CSN: [id number] Arrival date & time: 09/02/20  0818      History   Chief Complaint Chief Complaint  Patient presents with   Cough    HPI Belinda Norman is a 60 y.o. female.   HPI  60 year old female here for evaluation of respiratory complaints.  Patient reports that she has been experiencing a cough with chest congestion and chest soreness for the past week.  Her cough is nonproductive.  She has been using Promethazine DM cough syrup which has been helping.  She states that her chest feels tight and she has had to use her albuterol inhaler which has been helping.  Her cough increases with talking.  She does have some postnasal drip with nasal congestion, ear pressure, and a sore throat.  She also has nausea currently.  She states that she has not had a fever, runny nose, wheezing or shortness of breath, vomiting or diarrhea, or body aches.  A coworker recently had the same symptoms but she was negative for COVID.  Past Medical History:  Diagnosis Date   Anxiety    Arthritis    Cancer ([place])    breast cancer. 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   Stomach irritation 06/07/2017   Mastalgia 08/14/2016   History of mastectomy, right 08/10/2016   Primary osteoarthritis of right knee 03/24/2016   Muscle spasm 11/12/2015   Breast cancer ([place]) 09/27/2013   Osteoporosis 08/30/2013   Premature surgical menopause 08/30/2013   SI (sacroiliac) pain 12/21/2012   Lumbosacral facet joint syndrome 12/21/2012   Back pain 12/18/2012   Hemorrhoids 12/18/2012   Thrombocytopenia ([place])  12/18/2012   GERD (gastroesophageal reflux disease) 12/05/2011   Depression 12/05/2011   Hepatitis C 11/18/2011   Disorder of sacrum 09/15/2011    Past Surgical History:  Procedure Laterality Date   ABDOMINAL HYSTERECTOMY     arthroscopic knee surgery     x2   BREAST SURGERY     mastectomies then reconstructive surgery times 2.   CESAREAN SECTION     CHOLECYSTECTOMY  10/1998   COLONOSCOPY WITH PROPOFOL N/A 10/28/2018   Procedure: COLONOSCOPY WITH PROPOFOL;  Surgeon: Virgel Manifold, MD;  Location: [facility];  Service: Endoscopy;  Laterality: N/A;   ESOPHAGOGASTRODUODENOSCOPY (EGD) WITH PROPOFOL N/A 10/28/2018   Procedure: ESOPHAGOGASTRODUODENOSCOPY (EGD) WITH PROPOFOL;  Surgeon: Virgel Manifold, MD;  Location: [facility];  Service: Endoscopy;  Laterality: N/A;   FOOT SURGERY     KNEE ARTHROPLASTY Right 06/22/2018   Procedure: COMPUTER ASSISTED TOTAL KNEE ARTHROPLASTY RIGHT - Will need an RNFA;  Surgeon: Dereck Leep, MD;  Location: [facility] ORS;  Service: Orthopedics;  Laterality: Right;   knee surgery with nerve repair     MASTECTOMY Bilateral    MASTECTOMY, MODIFIED RADICAL W/RECONSTRUCTION     TONSILLECTOMY     TOTAL ABDOMINAL HYSTERECTOMY W/ BILATERAL SALPINGOOPHORECTOMY  1999    OB History   No obstetric history on file.      Home Medications  Prior to Admission medications   Medication Sig Start Date End Date Taking? Authorizing Provider  acetaminophen (TYLENOL) 500 MG tablet Take 500 mg by mouth every 6 (six) hours as needed for moderate pain.   Yes [provider]  acyclovir (ZOVIRAX) 400 MG tablet Take 400 mg by mouth 5 (five) times daily as needed.   Yes [provider]  bisacodyl (BISACODYL) 5 MG EC tablet Take 2 tablets ('10mg'$ ) by mouth the day before your procedure between 1pm-3pm. 09/27/18  Yes Tahiliani, Lennette Bihari, MD  Cholecalciferol (VITAMIN D3) 1000 units CAPS Take 2,000 Units by mouth daily.    Yes [provider]  citalopram (CELEXA) 10 MG tablet Take 10 mg by mouth daily.   Yes [provider]  clonazePAM (KLONOPIN) 1 MG tablet Take 1 mg by mouth daily as needed for anxiety.    Yes [provider]  clonazePAM (KLONOPIN) 1 MG tablet Take 1 mg by mouth daily as needed for anxiety.   Yes [provider]  desipramine (NOPRAMIN) 10 MG tablet Take 10 mg by mouth at bedtime. 02/26/20  Yes [provider]  dicyclomine (BENTYL) 10 MG capsule Take 1 capsule (10 mg total) by mouth 3 (three) times daily as needed for up to 30 doses for spasms. 05/11/19  Yes [name] B, MD  DULoxetine (CYMBALTA) 30 MG capsule Take 90 mg by mouth daily. 12/15/15  Yes [provider]  fexofenadine (ALLEGRA) 180 MG tablet Take 180 mg by mouth daily.   Yes [provider]  fluticasone (FLONASE) 50 MCG/ACT nasal spray Place 2 sprays into both nostrils daily as needed for allergies or rhinitis.   Yes [provider]  ipratropium (ATROVENT) 0.06 % nasal spray Place 2 sprays into both nostrils 4 (four) times daily. 09/02/20  Yes Margarette Canada, NP  omeprazole (PRILOSEC) 40 MG capsule Take 1 capsule (40 mg total) by mouth in the morning and at bedtime. 06/19/19  Yes Virgel Manifold, MD  predniSONE (DELTASONE) 50 MG tablet Take 1 tablet daily by mouth for 5 days. 09/02/20  Yes Margarette Canada, NP  promethazine-dextromethorphan (PROMETHAZINE-DM) 6.25-15 MG/5ML syrup Take 5 mLs by mouth 4 (four) times daily as needed for cough. 05/28/20  Yes Verda Cumins, MD  Spacer/Aero-Holding Chambers (AEROCHAMBER MV) inhaler Use as instructed 09/02/20  Yes Margarette Canada, NP  albuterol (VENTOLIN HFA) 108 (90 Base) MCG/ACT inhaler Inhale 2 puffs into the lungs every 6 (six) hours as needed for wheezing. 09/02/20   Margarette Canada, NP  azelastine (ASTELIN) 0.1 % nasal spray Place 2 sprays into the nose 2 (two) times daily. 04/11/18 04/11/19  [provider]    Family History Family History   Problem Relation Age of Onset   Hypertension Father    Liver cancer Mother        bile duct cancer   Alcohol abuse Mother    Diabetes Brother    Breast cancer Paternal Aunt    Throat cancer Paternal Grandmother    Prostate cancer Paternal Grandfather    Colon cancer Neg Hx     Social History Social History   Tobacco Use   Smoking status: Never   Smokeless tobacco: Never  Vaping Use   Vaping Use: Never used  Substance Use Topics   Alcohol use: Yes    Comment: none last 24hrs   Drug use: Yes    Types: Marijuana     Allergies   Percocet [oxycodone-acetaminophen], Zofran [ondansetron hcl], Codeine, Epinephrine, and Morphine and related  Review of Systems Review of Systems  Constitutional:  Negative for activity change, appetite change and fever.  HENT:  Positive for congestion, ear pain, postnasal drip and sore throat. Negative for rhinorrhea.   Respiratory:  Positive for cough and chest tightness. Negative for shortness of breath and wheezing.   Gastrointestinal:  Positive for nausea. Negative for diarrhea and vomiting.  Musculoskeletal:  Negative for arthralgias and myalgias.  Skin:  Negative for rash.  Neurological:  Negative for headaches.  Hematological: Negative.   Psychiatric/Behavioral: Negative.      Physical Exam Triage Vital Signs ED Triage Vitals  Enc Vitals Group     BP 09/02/20 0834 124/81     Pulse Rate 09/02/20 0834 79     Resp 09/02/20 0834 16     Temp 09/02/20 0834 99 F (37.2 C)     Temp Source 09/02/20 0834 Oral     SpO2 09/02/20 0834 100 %     Weight 09/02/20 0833 163 lb (73.9 kg)     Height 09/02/20 0833 '5\' 6"'$  (1.676 m)     Head Circumference --      Peak Flow --      Pain Score 09/02/20 0832 6     Pain Loc --      Pain Edu? --      Excl. in [place]? --    No data found.  Updated Vital Signs BP 124/81 (BP Location: Right Arm)   Pulse 79   Temp 99 F (37.2 C) (Oral)   Resp 16   Ht '5\' 6"'$  (1.676 m)   Wt 163 lb (73.9 kg)   SpO2  100%   BMI 26.31 kg/m   Visual Acuity Right Eye Distance:   Left Eye Distance:   Bilateral Distance:    Right Eye Near:   Left Eye Near:    Bilateral Near:     Physical Exam Vitals and nursing note reviewed.  Constitutional:      General: She is not in acute distress.    Appearance: Normal appearance. She is not ill-appearing.  HENT:     Head: Normocephalic and atraumatic.     Right Ear: Tympanic membrane, ear canal and external ear normal. There is no impacted cerumen.     Left Ear: Tympanic membrane, ear canal and external ear normal. There is no impacted cerumen.     Nose: Congestion and rhinorrhea present.     Mouth/Throat:     Mouth: Mucous membranes are moist.     Pharynx: Oropharynx is clear. Posterior oropharyngeal erythema present.  Cardiovascular:     Rate and Rhythm: Normal rate and regular rhythm.     Pulses: Normal pulses.     Heart sounds: Normal heart sounds. No murmur heard.   No gallop.  Pulmonary:     Effort: Pulmonary effort is normal.     Breath sounds: Normal breath sounds. No wheezing, rhonchi or rales.  Musculoskeletal:     Cervical back: Normal range of motion and neck supple.  Lymphadenopathy:     Cervical: No cervical adenopathy.  Skin:    General: Skin is warm and dry.     Capillary Refill: Capillary refill takes less than 2 seconds.     Findings: No erythema or rash.  Neurological:     General: No focal deficit present.     Mental Status: She is alert and oriented to person, place, and time.  Psychiatric:        Mood and Affect: Mood normal.  Behavior: Behavior normal.        Thought Content: Thought content normal.        Judgment: Judgment normal.     UC Treatments / Results  Labs (all labs ordered are listed, but only abnormal results are displayed) Labs Reviewed  SARS CORONAVIRUS 2 (TAT 6-24 HRS)    EKG   Radiology No results found.  Procedures Procedures (including critical care time)  Medications Ordered in  UC Medications - No data to display  Initial Impression / Assessment and Plan / UC Course  I have reviewed the triage vital signs and the nursing notes.  Pertinent labs & imaging results that were available during my care of the patient were reviewed by me and considered in my medical decision making (see chart for details).  Very pleasant, nontoxic-appearing 60 year old female here for evaluation of respiratory complaints of been going on for the past week and include a nonproductive cough with some postnasal drip, nasal congestion, ear pressure, sore throat, and nausea.  She is concerned because she is supposed to be having carpal tunnel surgery on Monday and she wants to make sure she is not infectious so she can continue to have her surgery.  Patient reports that she has been having to use her inhaler, and she reports that over the past year when she has been sick she has been needed an inhaler, but she does not carry a diagnosis of asthma or COPD.  She states that her children and her sister have asthma.  She reports that she has performed a PFT in the past and had difficulty blowing out but she does remember being labeled with reactive airway disease or any diagnosis such as that.  Patient's physical exam reveals protegrin tympanic membranes bilaterally with a normal light reflex and clear external auditory canals.  Nasal mucosa is mildly edematous and pale with clear nasal discharge.  There is mild posterior oropharyngeal erythema with clear postnasal drip.  No cervical lymphadenopathy appreciated on exam.  Patient does complain of tenderness with palpation over the larynx but she has no laryngitis.  Lung sounds are clear to auscultation in all fields.  Suspect patient has a viral URI versus allergic rhinitis.  Will swab for COVID due to her upcoming surgery though I explained the patient that she is had symptoms for a week so she is outside of her quarantine.  And by the time surgery rolls around she  will be fully passed the 10-day quarantine period.  We will treat patient with Atrovent nasal spray to help with nasal congestion and postnasal drip, have her continue to use her albuterol inhaler I prescribed a spacer to help with chest tightness.  She states that [employer] do not work for her but the Promethazine DM cough syrup does not she still has some on hand.  I have encouraged her to continue using that as needed for cough.  There may be a reactive airway component and her cough may be a variant for bronchoconstriction so we will cover with 5 days of prednisone to see if this helps her symptoms.  I have also recommend patient start using with the over-the-counter antihistamines such as Allegra, Zyrtec, or Claritin to see if that helps with her symptoms.  ER and return precautions reviewed with patient.   Final Clinical Impressions(s) / UC Diagnoses   Final diagnoses:  Viral URI with cough     Discharge Instructions      Isolate at home pending the results of  your COVID test.  If you test positive then you will have to quarantine for 5 days from the start of your symptoms.  After 5 days you can break quarantine if your symptoms have improved and you have not had a fever for 24 hours without taking Tylenol or ibuprofen.  Use over-the-counter Tylenol and ibuprofen as needed for body aches and fever.  Use the  Promethazine DM cough syrup at nighttime as will make you drowsy.  Use the Atrovent nasal spray, 2 squirts in each nostril every 6 hours, zs needed for congestion and postnasal drip.  Use your Albuterol inhaler with spacer, 2 puffs every 4-6 hours as needed for chest tightness.   Take the Prednisone daily at breakfast for 5 days.  Try using Allegra, Claritin, or Zyrtec to see if this helps your symptoms.   If you develop any increased shortness of breath-especially at rest, you are unable to speak in full sentences, or is a late sign your lips are turning blue you need to  go the ER for evaluation.      ED Prescriptions     Medication Sig Dispense Auth. Provider   ipratropium (ATROVENT) 0.06 % nasal spray Place 2 sprays into both nostrils 4 (four) times daily. 15 mL Margarette Canada, NP   predniSONE (DELTASONE) 50 MG tablet Take 1 tablet daily by mouth for 5 days. 5 tablet Margarette Canada, NP   albuterol (VENTOLIN HFA) 108 (90 Base) MCG/ACT inhaler Inhale 2 puffs into the lungs every 6 (six) hours as needed for wheezing. 18 g Margarette Canada, NP   Spacer/Aero-Holding Chambers (AEROCHAMBER MV) inhaler Use as instructed 1 each Margarette Canada, NP      PDMP not reviewed this encounter.   Margarette Canada, NP 09/02/20 [phone]

## 2020-09-02 NOTE — ED Triage Notes (Signed)
Pt here with C/O cough chest congestion, sore chest from coughing for 1 week. Has tried cough medicine that was given for a cough a couple months ago. No fever or sore throat.  Is having carpal tunnel  surgery on Wednesday the 31st.

## 2021-02-28 ENCOUNTER — Other Ambulatory Visit: Payer: Self-pay | Admitting: Orthopedic Surgery

## 2021-02-28 DIAGNOSIS — M25561 Pain in right knee: Secondary | ICD-10-CM

## 2021-03-06 ENCOUNTER — Encounter
Admission: RE | Admit: 2021-03-06 | Discharge: 2021-03-06 | Disposition: A | Discharge status: Home / Self Care | Payer: BC Managed Care – PPO | Source: Ambulatory Visit | Attending: Orthopedic Surgery | Admitting: Orthopedic Surgery

## 2021-03-06 ENCOUNTER — Other Ambulatory Visit: Payer: Self-pay

## 2021-03-06 DIAGNOSIS — M25561 Pain in right knee: Secondary | ICD-10-CM

## 2021-03-06 MED ORDER — TECHNETIUM TC 99M MEDRONATE IV KIT
22.2100 | PACK | Freq: Once | INTRAVENOUS | Status: AC | PRN
  Administered 2021-03-06: 22.21 via INTRAVENOUS

## 2021-05-21 NOTE — H&P (Signed)
TOTAL KNEE ADMISSION H&P ? ?Patient is being admitted for right total knee arthroplasty. ? ?Subjective: ? ?Chief Complaint: Right knee pain. ? ?HPI: Belinda Norman, 61 y.o. female who presents for evaluation of right knee pain. She is status post right total knee arthroplasty on 06/22/2018. The patient indicates she continued to have a few problems from the beginning of her right total knee arthroplasty. She notes she returned to physical therapy and her range of motion was good. She states her muscles were weak. ? ?The patient indicates she was frustrated with her progress. She notes her pain has continued and worsened over the past year. She describes pain and pressure in her knee itself. She states she has occasional swelling in her knee. ? ?The patient indicates she is unable to ambulate long distances or stand on her right lower extremity for prolonged periods of time. She notes she is unable to live her life or dance due to her pain. The patient indicates her knee feels as if it wants to give out on her when she ambulates, especially when she is descending steps or when she gets up from a seated position. ? ?Patient Active Problem List  ? Diagnosis Date Noted  ? Abdominal pain, epigastric   ? Heartburn   ? Screening for colon cancer   ? Polyp of colon   ? 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 Stomach irritation 06/07/2017  ? Mastalgia 08/14/2016  ? History of mastectomy, right 08/10/2016  ? Primary osteoarthritis of right knee 03/24/2016  ? Muscle spasm 11/12/2015  ? Breast cancer ([place]) 09/27/2013  ? Osteoporosis 08/30/2013  ? Premature surgical menopause 08/30/2013  ? SI (sacroiliac) pain 12/21/2012  ? Lumbosacral facet joint syndrome 12/21/2012  ? Back pain 12/18/2012  ? Hemorrhoids 12/18/2012  ? Thrombocytopenia ([place]) 12/18/2012  ? GERD (gastroesophageal reflux disease) 12/05/2011  ? Depression 12/05/2011  ? Hepatitis C 11/18/2011  ? Disorder of sacrum 09/15/2011  ? ? ?Past Medical History:   ?Diagnosis Date  ? Anxiety   ? Arthritis   ? Cancer [facility])   ? breast cancer. had bilateral mastectomies  ? DCIS (ductal carcinoma in situ)   ? Depression   ? GERD (gastroesophageal reflux disease)   ? Hepatitis C   ? cured in 2014  ? HSV (herpes simplex virus) infection   ? IBS (irritable bowel syndrome)   ? ? ?Past Surgical History:  ?Procedure Laterality Date  ? ABDOMINAL HYSTERECTOMY    ? arthroscopic knee surgery    ? x2  ? BREAST SURGERY    ? mastectomies then reconstructive surgery times 2.  ? CESAREAN SECTION    ? CHOLECYSTECTOMY  10/1998  ? COLONOSCOPY WITH PROPOFOL N/A 10/28/2018  ? Procedure: COLONOSCOPY WITH PROPOFOL;  Surgeon: Virgel Manifold, MD;  Location: [facility];  Service: Endoscopy;  Laterality: N/A;  ? ESOPHAGOGASTRODUODENOSCOPY (EGD) WITH PROPOFOL N/A 10/28/2018  ? Procedure: ESOPHAGOGASTRODUODENOSCOPY (EGD) WITH PROPOFOL;  Surgeon: Virgel Manifold, MD;  Location: [facility];  Service: Endoscopy;  Laterality: N/A;  ? FOOT SURGERY    ? KNEE ARTHROPLASTY Right 06/22/2018  ? Procedure: COMPUTER ASSISTED TOTAL KNEE ARTHROPLASTY RIGHT - Will need an RNFA;  Surgeon: Dereck Leep, MD;  Location: [facility] ORS;  Service: Orthopedics;  Laterality: Right;  ? knee surgery with nerve repair    ? MASTECTOMY Bilateral   ? MASTECTOMY, MODIFIED RADICAL W/RECONSTRUCTION    ? TONSILLECTOMY    ? TOTAL ABDOMINAL HYSTERECTOMY W/ BILATERAL SALPINGOOPHORECTOMY  1999  ? ? ?Prior  to Admission medications   ?Medication Sig Start Date End Date Taking? Authorizing Provider  ?acetaminophen (TYLENOL) 500 MG tablet Take 500 mg by mouth every 6 (six) hours as needed for moderate pain.    [provider]  ?acyclovir (ZOVIRAX) 400 MG tablet Take 400 mg by mouth 5 (five) times daily as needed.    [provider]  ?albuterol (VENTOLIN HFA) 108 (90 Base) MCG/ACT inhaler Inhale 2 puffs into the lungs every 6 (six) hours as needed for wheezing. 09/02/20   Margarette Canada, NP  ?azelastine (ASTELIN) 0.1 %  nasal spray Place 2 sprays into the nose 2 (two) times daily. 04/11/18 04/11/19  [provider]  ?bisacodyl (BISACODYL) 5 MG EC tablet Take 2 tablets ('10mg'$ ) by mouth the day before your procedure between 1pm-3pm. 09/27/18   Virgel Manifold, MD  ?Cholecalciferol (VITAMIN D3) 1000 units CAPS Take 2,000 Units by mouth daily.     [provider]  ?citalopram (CELEXA) 10 MG tablet Take 10 mg by mouth daily.    [provider]  ?clonazePAM (KLONOPIN) 1 MG tablet Take 1 mg by mouth daily as needed for anxiety.     [provider]  ?clonazePAM (KLONOPIN) 1 MG tablet Take 1 mg by mouth daily as needed for anxiety.    [provider]  ?desipramine (NOPRAMIN) 10 MG tablet Take 10 mg by mouth at bedtime. 02/26/20   [provider]  ?dicyclomine (BENTYL) 10 MG capsule Take 1 capsule (10 mg total) by mouth 3 (three) times daily as needed for up to 30 doses for spasms. 05/11/19   Virgel Manifold, MD  ?DULoxetine (CYMBALTA) 30 MG capsule Take 90 mg by mouth daily. 12/15/15   [provider]  ?fexofenadine (ALLEGRA) 180 MG tablet Take 180 mg by mouth daily.    [provider]  ?fluticasone (FLONASE) 50 MCG/ACT nasal spray Place 2 sprays into both nostrils daily as needed for allergies or rhinitis.    [provider]  ?ipratropium (ATROVENT) 0.06 % nasal spray Place 2 sprays into both nostrils 4 (four) times daily. 09/02/20   Margarette Canada, NP  ?omeprazole (PRILOSEC) 40 MG capsule Take 1 capsule (40 mg total) by mouth in the morning and at bedtime. 06/19/19   Virgel Manifold, MD  ?predniSONE (DELTASONE) 50 MG tablet Take 1 tablet daily by mouth for 5 days. 09/02/20   Margarette Canada, NP  ?promethazine-dextromethorphan (PROMETHAZINE-DM) 6.25-15 MG/5ML syrup Take 5 mLs by mouth 4 (four) times daily as needed for cough. 05/28/20   Verda Cumins, MD  ?Spacer/Aero-Holding Chambers (AEROCHAMBER MV) inhaler Use as instructed 09/02/20   Margarette Canada, NP   ? ? ?Allergies  ?Allergen Reactions  ? Percocet [Oxycodone-Acetaminophen] Nausea And Vomiting  ?  Severe n & v. Could only take Dilaudid  ? Zofran [Ondansetron Hcl] Other (See Comments)  ?  headaches  ? Codeine Nausea And Vomiting and Nausea Only  ?  headache  ? Epinephrine Other (See Comments)  ?  Causes "craziness" with dental cases only  ? Morphine And Related Other (See Comments)  ?  During breast surgeries, did not work for pain  ? ? ?Social History  ? ?Socioeconomic History  ? Marital status: Divorced  ?  Spouse name: has a significant other  ? Number of children: 3  ? Years of education: Not on file  ? Highest education level: Not on file  ?Occupational History  ? Not on file  ?Tobacco Use  ? Smoking status: Never  ?  Smokeless tobacco: Never  ?Vaping Use  ? Vaping Use: Never used  ?Substance and Sexual Activity  ? Alcohol use: Yes  ?  Comment: none last 24hrs  ? Drug use: Yes  ?  Types: Marijuana  ? Sexual activity: Yes  ?Other Topics Concern  ? Not on file  ?Social History Narrative  ? Not on file  ? ?Social Determinants of Health  ? ?Financial Resource Strain: Not on file  ?Food Insecurity: Not on file  ?Transportation Needs: Not on file  ?Physical Activity: Not on file  ?Stress: Not on file  ?Social Connections: Not on file  ?Intimate Partner Violence: Not on file  ? ? ?Tobacco Use: Low Risk   ? Smoking Tobacco Use: Never  ? Smokeless Tobacco Use: Never  ? Passive Exposure: Not on file  ? ?Social History  ? ?Substance and Sexual Activity  ?Alcohol Use Yes  ? Comment: none last 24hrs  ? ? ?Family History  ?Problem Relation Age of Onset  ? Hypertension Father   ? Liver cancer Mother   ?     bile duct cancer  ? Alcohol abuse Mother   ? Diabetes Brother   ? Breast cancer Paternal Aunt   ? Throat cancer Paternal Grandmother   ? Prostate cancer Paternal Grandfather   ? Colon cancer Neg Hx   ? ? ?Review of Systems  ?Constitutional:  Negative for chills and fever.  ?HENT: Negative.    ?Eyes: Negative.    ?Respiratory:  Negative for cough and shortness of breath.   ?Cardiovascular:  Negative for chest pain and palpitations.  ?Gastrointestinal:  Negative for abdominal pain, constipation, diarrhea, nausea and vomiting.  ?Geni

## 2021-05-28 NOTE — Progress Notes (Addendum)
COVID Vaccine Completed: yes x2  Date of COVID positive in last 90 days: no  PCP - Roderic Scarce, MD Cardiologist - n/a  Medical clearance by Roderic Scarce 05/08/21 Epic  Chest x-ray - 08/30/20 [facility] EKG - 05/08/21 req Stress Test - n/a ECHO - n/a Cardiac Cath - n/a Pacemaker/ICD device last checked: n/a Spinal Cord Stimulator: n/a  Bowel Prep - no  Sleep Study - n/a CPAP -   Fasting Blood Sugar - n/a Checks Blood Sugar _____ times a day  Blood Thinner Instructions: n/a Aspirin Instructions: Last Dose:  Activity level: Can perform activities of daily living without stopping and without symptoms of chest pain or shortness of breath. No stairs currently due to knee       Anesthesia review:   Patient denies shortness of breath, fever, cough and chest pain at PAT appointment   Patient verbalized understanding of instructions that were given to them at the PAT appointment. Patient was also instructed that they will need to review over the PAT instructions again at home before surgery.

## 2021-05-28 NOTE — Patient Instructions (Addendum)
DUE TO COVID-19 ONLY TWO VISITORS  (aged 60 and older)  ARE ALLOWED TO COME WITH YOU AND STAY IN THE WAITING ROOM ONLY DURING PRE OP AND PROCEDURE.   **NO VISITORS ARE ALLOWED IN THE SHORT STAY AREA OR RECOVERY ROOM!!**  IF YOU WILL BE ADMITTED INTO THE HOSPITAL YOU ARE ALLOWED ONLY FOUR SUPPORT PEOPLE DURING VISITATION HOURS ONLY (7 AM -8PM)   The support person(s) must pass our screening, gel in and out, and wear a mask at all times, including in the patient's room. Patients must also wear a mask when staff or their support person are in the room. Visitors GUEST BADGE MUST BE WORN VISIBLY  One adult visitor may remain with you overnight and MUST be in the room by 8 P.M.     Your procedure is scheduled on: 06/11/21   Report to [facility] Main Entrance    Report to admitting at 1:05 PM   Call this number if you have problems the morning of surgery [phone]   Do not eat food :After Midnight.   After Midnight you may have the following liquids until 12:50 PM DAY OF SURGERY  Water Black Coffee (sugar ok, NO MILK/CREAM OR CREAMERS)  Tea (sugar ok, NO MILK/CREAM OR CREAMERS) regular and decaf                             Plain Jell-O (NO RED)                                           Fruit ices (not with fruit pulp, NO RED)                                     Popsicles (NO RED)                                                                  Juice: apple, WHITE grape, WHITE cranberry Sports drinks like Gatorade (NO RED) Clear broth(vegetable,chicken,beef)   The day of surgery:  Drink ONE (1) Pre-Surgery Clear Ensure at 12:50 PM the morning of surgery. Drink in one sitting. Do not sip.  This drink was given to you during your hospital  pre-op appointment visit. Nothing else to drink after completing the  Pre-Surgery Clear Ensure.          If you have questions, please contact your surgeon's office.   FOLLOW BOWEL PREP AND ANY ADDITIONAL PRE OP INSTRUCTIONS YOU RECEIVED  FROM YOUR SURGEON'S OFFICE!!!     Oral Hygiene is also important to reduce your risk of infection.                                    Remember - BRUSH YOUR TEETH THE MORNING OF SURGERY WITH YOUR REGULAR TOOTHPASTE   Take these medicines the morning of surgery with A SIP OF WATER: Tylenol, Inhalers, Citalopram, Clonazepam, Duloxetine, Allegra  You may not have any metal on your body including hair pins, jewelry, and body piercing             Do not wear make-up, lotions, powders, perfumes, or deodorant  Do not wear nail polish including gel and S&S, artificial/acrylic nails, or any other type of covering on natural nails including finger and toenails. If you have artificial nails, gel coating, etc. that needs to be removed by a nail salon please have this removed prior to surgery or surgery may need to be canceled/ delayed if the surgeon/ anesthesia feels like they are unable to be safely monitored.   Do not shave  48 hours prior to surgery.    Do not bring valuables to the hospital. [place].   Contacts, dentures or bridgework may not be worn into surgery.   Bring small overnight bag day of surgery.    Special Instructions: Bring a copy of your healthcare power of attorney and living will documents         the day of surgery if you haven't scanned them before.              Please read over the following fact sheets you were given: IF YOU HAVE QUESTIONS ABOUT YOUR PRE-OP INSTRUCTIONS PLEASE CALL [place] - Preparing for Surgery Before surgery, you can play an important role.  Because skin is not sterile, your skin needs to be as free of germs as possible.  You can reduce the number of germs on your skin by washing with CHG (chlorahexidine gluconate) soap before surgery.  CHG is an antiseptic cleaner which kills germs and bonds with the skin to continue killing germs even after  washing. Please DO NOT use if you have an allergy to CHG or antibacterial soaps.  If your skin becomes reddened/irritated stop using the CHG and inform your nurse when you arrive at Short Stay. Do not shave (including legs and underarms) for at least 48 hours prior to the first CHG shower.  You may shave your face/neck.  Please follow these instructions carefully:  1.  Shower with CHG Soap the night before surgery and the  morning of surgery.  2.  If you choose to wash your hair, wash your hair first as usual with your normal  shampoo.  3.  After you shampoo, rinse your hair and body thoroughly to remove the shampoo.                             4.  Use CHG as you would any other liquid soap.  You can apply chg directly to the skin and wash.  Gently with a scrungie or clean washcloth.  5.  Apply the CHG Soap to your body ONLY FROM THE NECK DOWN.   Do   not use on face/ open                           Wound or open sores. Avoid contact with eyes, ears mouth and   genitals (private parts).                       Wash face,  Genitals (private parts) with your normal soap.  6.  Wash thoroughly, paying special attention to the area where your    surgery  will be performed.  7.  Thoroughly rinse your body with warm water from the neck down.  8.  DO NOT shower/wash with your normal soap after using and rinsing off the CHG Soap.                9.  Pat yourself dry with a clean towel.            10.  Wear clean pajamas.            11.  Place clean sheets on your bed the night of your first shower and do not  sleep with pets. Day of Surgery : Do not apply any lotions/deodorants the morning of surgery.  Please wear clean clothes to the hospital/surgery center.  FAILURE TO FOLLOW THESE INSTRUCTIONS MAY RESULT IN THE CANCELLATION OF YOUR SURGERY  PATIENT SIGNATURE_________________________________  NURSE  SIGNATURE__________________________________  ________________________________________________________________________   Adam Phenix  An incentive spirometer is a tool that can help keep your lungs clear and active. This tool measures how well you are filling your lungs with each breath. Taking long deep breaths may help reverse or decrease the chance of developing breathing (pulmonary) problems (especially infection) following: A long period of time when you are unable to move or be active. BEFORE THE PROCEDURE  If the spirometer includes an indicator to show your best effort, your nurse or respiratory therapist will set it to a desired goal. If possible, sit up straight or lean slightly forward. Try not to slouch. Hold the incentive spirometer in an upright position. INSTRUCTIONS FOR USE  Sit on the edge of your bed if possible, or sit up as far as you can in bed or on a chair. Hold the incentive spirometer in an upright position. Breathe out normally. Place the mouthpiece in your mouth and seal your lips tightly around it. Breathe in slowly and as deeply as possible, raising the piston or the ball toward the top of the column. Hold your breath for 3-5 seconds or for as long as possible. Allow the piston or ball to fall to the bottom of the column. Remove the mouthpiece from your mouth and breathe out normally. Rest for a few seconds and repeat Steps 1 through 7 at least 10 times every 1-2 hours when you are awake. Take your time and take a few normal breaths between deep breaths. The spirometer may include an indicator to show your best effort. Use the indicator as a goal to work toward during each repetition. After each set of 10 deep breaths, practice coughing to be sure your lungs are clear. If you have an incision (the cut made at the time of surgery), support your incision when coughing by placing a pillow or rolled up towels firmly against it. Once you are able to get out of  bed, walk around indoors and cough well. You may stop using the incentive spirometer when instructed by your caregiver.  RISKS AND COMPLICATIONS Take your time so you do not get dizzy or light-headed. If you are in pain, you may need to take or ask for pain medication before doing incentive spirometry. It is harder to take a deep breath if you are having pain. AFTER USE Rest and breathe slowly and easily. It can be helpful to keep track of a log of your progress. Your caregiver can provide you with a simple table to help with this.  If you are using the spirometer at home, follow these instructions: [place] IF:  You are having difficultly using the spirometer. You have trouble using the spirometer as often as instructed. Your pain medication is not giving enough relief while using the spirometer. You develop fever of 100.5 F (38.1 C) or higher. SEEK IMMEDIATE MEDICAL CARE IF:  You cough up bloody sputum that had not been present before. You develop fever of 102 F (38.9 C) or greater. You develop worsening pain at or near the incision site. MAKE SURE YOU:  Understand these instructions. Will watch your condition. Will get help right away if you are not doing well or get worse. Document Released: 05/11/2006 Document Revised: 03/23/2011 Document Reviewed: 07/12/2006 Exit[employer] Patient Information 2014 [employer], [place].   ________________________________________________________________________  WHAT IS A BLOOD TRANSFUSION? Blood Transfusion Information  A transfusion is the replacement of blood or some of its parts. Blood is made up of multiple cells which provide different functions. Red blood cells carry oxygen and are used for blood loss replacement. White blood cells fight against infection. Platelets control bleeding. Plasma helps clot blood. Other blood products are available for specialized needs, such as hemophilia or other clotting disorders. BEFORE THE TRANSFUSION   Who gives blood for transfusions?  Healthy volunteers who are fully evaluated to make sure their blood is safe. This is blood bank blood. Transfusion therapy is the safest it has ever been in the practice of medicine. Before blood is taken from a donor, a complete history is taken to make sure that person has no history of diseases nor engages in risky social behavior (examples are intravenous drug use or sexual activity with multiple partners). The donor's travel history is screened to minimize risk of transmitting infections, such as malaria. The donated blood is tested for signs of infectious diseases, such as HIV and hepatitis. The blood is then tested to be sure it is compatible with you in order to minimize the chance of a transfusion reaction. If you or a relative donates blood, this is often done in anticipation of surgery and is not appropriate for emergency situations. It takes many days to process the donated blood. RISKS AND COMPLICATIONS Although transfusion therapy is very safe and saves many lives, the main dangers of transfusion include:  Getting an infectious disease. Developing a transfusion reaction. This is an allergic reaction to something in the blood you were given. Every precaution is taken to prevent this. The decision to have a blood transfusion has been considered carefully by your caregiver before blood is given. Blood is not given unless the benefits outweigh the risks. AFTER THE TRANSFUSION Right after receiving a blood transfusion, you will usually feel much better and more energetic. This is especially true if your red blood cells have gotten low (anemic). The transfusion raises the level of the red blood cells which carry oxygen, and this usually causes an energy increase. The nurse administering the transfusion will monitor you carefully for complications. HOME CARE INSTRUCTIONS  No special instructions are needed after a transfusion. You may find your energy is  better. Speak with your caregiver about any limitations on activity for underlying diseases you may have. SEEK MEDICAL CARE IF:  Your condition is not improving after your transfusion. You develop redness or irritation at the intravenous (IV) site. SEEK IMMEDIATE MEDICAL CARE IF:  Any of the following symptoms occur over the next 12 hours: Shaking chills. You have a temperature by mouth above 102 F (38.9 C),  not controlled by medicine. Chest, back, or muscle pain. People around you feel you are not acting correctly or are confused. Shortness of breath or difficulty breathing. Dizziness and fainting. You get a rash or develop hives. You have a decrease in urine output. Your urine turns a dark color or changes to pink, red, or brown. Any of the following symptoms occur over the next 10 days: You have a temperature by mouth above 102 F (38.9 C), not controlled by medicine. Shortness of breath. Weakness after normal activity. The white part of the eye turns yellow (jaundice). You have a decrease in the amount of urine or are urinating less often. Your urine turns a dark color or changes to pink, red, or brown. Document Released: 12/27/1999 Document Revised: 03/23/2011 Document Reviewed: 08/15/2007 [facility] Patient Information 2014 [place], [place].  _______________________________________________________________________

## 2021-05-29 ENCOUNTER — Encounter (HOSPITAL_COMMUNITY): Payer: Self-pay

## 2021-05-29 ENCOUNTER — Encounter (HOSPITAL_COMMUNITY)
Admission: RE | Admit: 2021-05-29 | Discharge: 2021-05-29 | Disposition: A | Discharge status: Home / Self Care | Payer: BC Managed Care – PPO | Source: Ambulatory Visit | Attending: Orthopedic Surgery | Admitting: Orthopedic Surgery

## 2021-05-29 VITALS — BP 126/82 | HR 86 | Temp 98.7°F | Resp 16 | Ht 66.0 in | Wt 166.4 lb

## 2021-05-29 DIAGNOSIS — Z01812 Encounter for preprocedural laboratory examination: Secondary | ICD-10-CM | POA: Diagnosis present

## 2021-05-29 DIAGNOSIS — K759 Inflammatory liver disease, unspecified: Secondary | ICD-10-CM | POA: Insufficient documentation

## 2021-05-29 DIAGNOSIS — D696 Thrombocytopenia, unspecified: Secondary | ICD-10-CM | POA: Insufficient documentation

## 2021-05-29 DIAGNOSIS — Z01818 Encounter for other preprocedural examination: Secondary | ICD-10-CM

## 2021-05-29 LAB — COMPREHENSIVE METABOLIC PANEL
ALT: 26 U/L (ref 0–44)
AST: 21 U/L (ref 15–41)
Albumin: 4.1 g/dL (ref 3.5–5.0)
Alkaline Phosphatase: 53 U/L (ref 38–126)
Anion gap: 5 (ref 5–15)
BUN: 10 mg/dL (ref 6–20)
CO2: 27 mmol/L (ref 22–32)
Calcium: 9.3 mg/dL (ref 8.9–10.3)
Chloride: 105 mmol/L (ref 98–111)
Creatinine, Ser: 0.68 mg/dL (ref 0.44–1.00)
GFR, Estimated: >60 mL/min (ref >60)
Glucose, Bld: 106 mg/dL — ABNORMAL HIGH (ref 70–99)
Potassium: 4 mmol/L (ref 3.5–5.1)
Sodium: 137 mmol/L (ref 135–145)
Total Bilirubin: 0.6 mg/dL (ref 0.3–1.2)
Total Protein: 7.4 g/dL (ref 6.5–8.1)

## 2021-05-29 LAB — CBC
HCT: 42.4 % (ref 36.0–46.0)
Hemoglobin: 15 g/dL (ref 12.0–15.0)
MCH: 33.5 pg (ref 26.0–34.0)
MCHC: 35.4 g/dL (ref 30.0–36.0)
MCV: 94.6 fL (ref 80.0–100.0)
Platelets: 195 10*3/uL (ref 150–400)
RBC: 4.48 MIL/uL (ref 3.87–5.11)
RDW: 12 % (ref 11.5–15.5)
WBC: 4.8 10*3/uL (ref 4.0–10.5)
nRBC: 0 % (ref 0.0–0.2)

## 2021-05-29 LAB — TYPE AND SCREEN
ABO/RH(D): O POS
Antibody Screen: NEGATIVE

## 2021-05-29 LAB — SURGICAL PCR SCREEN
MRSA, PCR: NEGATIVE
Staphylococcus aureus: NEGATIVE

## 2021-06-10 ENCOUNTER — Encounter (HOSPITAL_COMMUNITY): Payer: Self-pay | Admitting: Orthopedic Surgery

## 2021-06-11 ENCOUNTER — Other Ambulatory Visit: Payer: Self-pay

## 2021-06-11 ENCOUNTER — Encounter (HOSPITAL_COMMUNITY)
Admission: RE | Disposition: A | Discharge status: Home / Self Care | Payer: Self-pay | Source: Ambulatory Visit | Attending: Orthopedic Surgery

## 2021-06-11 ENCOUNTER — Observation Stay (HOSPITAL_COMMUNITY)
Admission: RE | Admit: 2021-06-11 | Discharge: 2021-06-12 | Disposition: A | Discharge status: Home / Self Care | Payer: BC Managed Care – PPO | Source: Ambulatory Visit | Attending: Orthopedic Surgery | Admitting: Orthopedic Surgery

## 2021-06-11 ENCOUNTER — Ambulatory Visit (HOSPITAL_COMMUNITY): Payer: BC Managed Care – PPO | Admitting: Anesthesiology

## 2021-06-11 ENCOUNTER — Encounter (HOSPITAL_COMMUNITY): Payer: Self-pay | Admitting: Orthopedic Surgery

## 2021-06-11 DIAGNOSIS — T84022A Instability of internal right knee prosthesis, initial encounter: Principal | ICD-10-CM | POA: Insufficient documentation

## 2021-06-11 DIAGNOSIS — T84018A Broken internal joint prosthesis, other site, initial encounter: Secondary | ICD-10-CM

## 2021-06-11 DIAGNOSIS — Z853 Personal history of malignant neoplasm of breast: Secondary | ICD-10-CM | POA: Diagnosis not present

## 2021-06-11 DIAGNOSIS — Z79899 Other long term (current) drug therapy: Secondary | ICD-10-CM | POA: Insufficient documentation

## 2021-06-11 DIAGNOSIS — Y831 Surgical operation with implant of artificial internal device as the cause of abnormal reaction of the patient, or of later complication, without mention of misadventure at the time of the procedure: Secondary | ICD-10-CM | POA: Diagnosis not present

## 2021-06-11 DIAGNOSIS — T84012A Broken internal right knee prosthesis, initial encounter: Secondary | ICD-10-CM

## 2021-06-11 HISTORY — PX: TOTAL KNEE REVISION: SHX996

## 2021-06-11 SURGERY — TOTAL KNEE REVISION
Anesthesia: General | Site: Knee | Laterality: Right

## 2021-06-11 MED ORDER — METHOCARBAMOL 500 MG PO TABS
500.0000 mg | ORAL_TABLET | Freq: Four times a day (QID) | ORAL | Status: DC | PRN
  Administered 2021-06-12 (×2): 500 mg via ORAL
  Filled 2021-06-11 (×2): qty 1

## 2021-06-11 MED ORDER — BISACODYL 10 MG RE SUPP: 10.0000 mg | Freq: Every day | RECTAL | Status: DC | PRN

## 2021-06-11 MED ORDER — SUGAMMADEX SODIUM 200 MG/2ML IV SOLN
INTRAVENOUS | Status: DC | PRN
  Administered 2021-06-11: 200 mg via INTRAVENOUS

## 2021-06-11 MED ORDER — FENTANYL CITRATE (PF) 100 MCG/2ML IJ SOLN
INTRAMUSCULAR | Status: DC | PRN
  Administered 2021-06-11 (×2): 50 ug via INTRAVENOUS
  Administered 2021-06-11: 100 ug via INTRAVENOUS

## 2021-06-11 MED ORDER — DOCUSATE SODIUM 100 MG PO CAPS
100.0000 mg | ORAL_CAPSULE | Freq: Two times a day (BID) | ORAL | Status: DC
  Administered 2021-06-11 – 2021-06-12 (×2): 100 mg via ORAL
  Filled 2021-06-11 (×2): qty 1

## 2021-06-11 MED ORDER — LORAZEPAM 2 MG/ML IJ SOLN: 0.5000 mg | Freq: Once | INTRAMUSCULAR | Status: AC

## 2021-06-11 MED ORDER — MENTHOL 3 MG MT LOZG: 1.0000 | LOZENGE | OROMUCOSAL | Status: DC | PRN

## 2021-06-11 MED ORDER — LACTATED RINGERS IV SOLN
INTRAVENOUS | Status: DC
Start: 2021-06-11 — End: 2021-06-11

## 2021-06-11 MED ORDER — POVIDONE-IODINE 10 % EX SWAB
2.0000 "application " | Freq: Once | CUTANEOUS | Status: AC
  Administered 2021-06-11: 2 via TOPICAL

## 2021-06-11 MED ORDER — MIDAZOLAM HCL 2 MG/2ML IJ SOLN
1.0000 mg | INTRAMUSCULAR | Status: DC
  Administered 2021-06-11: 2 mg via INTRAVENOUS
  Filled 2021-06-11: qty 2

## 2021-06-11 MED ORDER — DEXAMETHASONE SODIUM PHOSPHATE 10 MG/ML IJ SOLN: 10.0000 mg | Freq: Once | INTRAMUSCULAR | Status: DC

## 2021-06-11 MED ORDER — 0.9 % SODIUM CHLORIDE (POUR BTL) OPTIME
TOPICAL | Status: DC | PRN
  Administered 2021-06-11: 1000 mL

## 2021-06-11 MED ORDER — ORAL CARE MOUTH RINSE: 15.0000 mL | Freq: Once | OROMUCOSAL | Status: AC

## 2021-06-11 MED ORDER — BUPIVACAINE HCL (PF) 0.25 % IJ SOLN
INTRAMUSCULAR | Status: DC | PRN
  Administered 2021-06-11: 30 mL

## 2021-06-11 MED ORDER — HYDROMORPHONE HCL 1 MG/ML IJ SOLN
0.2500 mg | INTRAMUSCULAR | Status: DC | PRN
  Administered 2021-06-11 (×4): 0.5 mg via INTRAVENOUS

## 2021-06-11 MED ORDER — METOCLOPRAMIDE HCL 5 MG PO TABS: 5.0000 mg | ORAL_TABLET | Freq: Three times a day (TID) | ORAL | Status: DC | PRN

## 2021-06-11 MED ORDER — FENTANYL CITRATE PF 50 MCG/ML IJ SOSY
50.0000 ug | PREFILLED_SYRINGE | INTRAMUSCULAR | Status: DC
  Administered 2021-06-11 (×2): 50 ug via INTRAVENOUS
  Filled 2021-06-11: qty 2

## 2021-06-11 MED ORDER — DEXAMETHASONE SODIUM PHOSPHATE 10 MG/ML IJ SOLN
INTRAMUSCULAR | Status: DC | PRN
  Administered 2021-06-11: 10 mg via INTRAVENOUS

## 2021-06-11 MED ORDER — TRAMADOL HCL 50 MG PO TABS
50.0000 mg | ORAL_TABLET | Freq: Four times a day (QID) | ORAL | Status: DC | PRN
  Filled 2021-06-11: qty 2

## 2021-06-11 MED ORDER — DEXAMETHASONE SODIUM PHOSPHATE 10 MG/ML IJ SOLN: 8.0000 mg | Freq: Once | INTRAMUSCULAR | Status: DC

## 2021-06-11 MED ORDER — HYDROMORPHONE HCL 2 MG PO TABS
2.0000 mg | ORAL_TABLET | ORAL | Status: DC | PRN
  Administered 2021-06-11 – 2021-06-12 (×4): 4 mg via ORAL
  Filled 2021-06-11 (×5): qty 2

## 2021-06-11 MED ORDER — MIDAZOLAM HCL 2 MG/2ML IJ SOLN
INTRAMUSCULAR | Status: AC
  Filled 2021-06-11: qty 2

## 2021-06-11 MED ORDER — PROPOFOL 10 MG/ML IV BOLUS
INTRAVENOUS | Status: AC
  Filled 2021-06-11: qty 20

## 2021-06-11 MED ORDER — FENTANYL CITRATE (PF) 250 MCG/5ML IJ SOLN
INTRAMUSCULAR | Status: AC
  Filled 2021-06-11: qty 5

## 2021-06-11 MED ORDER — BUPIVACAINE LIPOSOME 1.3 % IJ SUSP: 20.0000 mL | Freq: Once | INTRAMUSCULAR | Status: DC

## 2021-06-11 MED ORDER — ROCURONIUM BROMIDE 10 MG/ML (PF) SYRINGE
PREFILLED_SYRINGE | INTRAVENOUS | Status: AC
Start: 2021-06-11 — End: ?
  Filled 2021-06-11: qty 10

## 2021-06-11 MED ORDER — LORAZEPAM 2 MG/ML IJ SOLN
INTRAMUSCULAR | Status: AC
  Administered 2021-06-11: 0.5 mg
  Filled 2021-06-11: qty 1

## 2021-06-11 MED ORDER — DIPHENHYDRAMINE HCL 12.5 MG/5ML PO ELIX: 12.5000 mg | ORAL_SOLUTION | ORAL | Status: DC | PRN

## 2021-06-11 MED ORDER — LIDOCAINE 2% (20 MG/ML) 5 ML SYRINGE
INTRAMUSCULAR | Status: DC | PRN
  Administered 2021-06-11: 80 mg via INTRAVENOUS

## 2021-06-11 MED ORDER — PHENOL 1.4 % MT LIQD: 1.0000 | OROMUCOSAL | Status: DC | PRN

## 2021-06-11 MED ORDER — ACETAMINOPHEN 500 MG PO TABS
1000.0000 mg | ORAL_TABLET | Freq: Four times a day (QID) | ORAL | Status: DC
  Administered 2021-06-11 – 2021-06-12 (×3): 1000 mg via ORAL
  Filled 2021-06-11 (×3): qty 2

## 2021-06-11 MED ORDER — METOCLOPRAMIDE HCL 5 MG/ML IJ SOLN
5.0000 mg | Freq: Three times a day (TID) | INTRAMUSCULAR | Status: DC | PRN
  Administered 2021-06-11: 10 mg via INTRAVENOUS
  Filled 2021-06-11: qty 2

## 2021-06-11 MED ORDER — LACTATED RINGERS IV SOLN: INTRAVENOUS | Status: DC

## 2021-06-11 MED ORDER — ACETAMINOPHEN 10 MG/ML IV SOLN
1000.0000 mg | Freq: Four times a day (QID) | INTRAVENOUS | Status: DC
  Administered 2021-06-11: 1000 mg via INTRAVENOUS
  Filled 2021-06-11: qty 100

## 2021-06-11 MED ORDER — TRANEXAMIC ACID-NACL 1000-0.7 MG/100ML-% IV SOLN
1000.0000 mg | INTRAVENOUS | Status: AC
  Administered 2021-06-11: 1000 mg via INTRAVENOUS
  Filled 2021-06-11: qty 100

## 2021-06-11 MED ORDER — HYDROMORPHONE HCL 1 MG/ML IJ SOLN
0.2500 mg | INTRAMUSCULAR | Status: DC | PRN
  Administered 2021-06-11 (×2): 0.5 mg via INTRAVENOUS

## 2021-06-11 MED ORDER — CEFAZOLIN SODIUM-DEXTROSE 2-4 GM/100ML-% IV SOLN
2.0000 g | Freq: Four times a day (QID) | INTRAVENOUS | Status: AC
  Administered 2021-06-11 – 2021-06-12 (×2): 2 g via INTRAVENOUS
  Filled 2021-06-11 (×2): qty 100

## 2021-06-11 MED ORDER — PROPOFOL 10 MG/ML IV BOLUS
INTRAVENOUS | Status: DC | PRN
  Administered 2021-06-11: 150 mg via INTRAVENOUS

## 2021-06-11 MED ORDER — CHLORHEXIDINE GLUCONATE 0.12 % MT SOLN
15.0000 mL | Freq: Once | OROMUCOSAL | Status: AC
  Administered 2021-06-11: 15 mL via OROMUCOSAL

## 2021-06-11 MED ORDER — HYDROMORPHONE HCL 1 MG/ML IJ SOLN
INTRAMUSCULAR | Status: AC
  Filled 2021-06-11: qty 2

## 2021-06-11 MED ORDER — ROCURONIUM BROMIDE 100 MG/10ML IV SOLN
INTRAVENOUS | Status: DC | PRN
  Administered 2021-06-11: 80 mg via INTRAVENOUS

## 2021-06-11 MED ORDER — RIVAROXABAN 10 MG PO TABS
10.0000 mg | ORAL_TABLET | Freq: Every day | ORAL | Status: DC
  Administered 2021-06-12: 10 mg via ORAL
  Filled 2021-06-11: qty 1

## 2021-06-11 MED ORDER — STERILE WATER FOR IRRIGATION IR SOLN
Status: DC | PRN
  Administered 2021-06-11: 2000 mL

## 2021-06-11 MED ORDER — BUPIVACAINE-EPINEPHRINE (PF) 0.5% -1:200000 IJ SOLN
INTRAMUSCULAR | Status: DC | PRN
  Administered 2021-06-11: 20 mL via PERINEURAL

## 2021-06-11 MED ORDER — HYDROMORPHONE HCL 1 MG/ML IJ SOLN: 0.5000 mg | INTRAMUSCULAR | Status: DC | PRN

## 2021-06-11 MED ORDER — EPHEDRINE SULFATE-NACL 50-0.9 MG/10ML-% IV SOSY
PREFILLED_SYRINGE | INTRAVENOUS | Status: DC | PRN
  Administered 2021-06-11 (×2): 7.5 mg via INTRAVENOUS

## 2021-06-11 MED ORDER — METHOCARBAMOL 500 MG IVPB - SIMPLE MED
500.0000 mg | Freq: Four times a day (QID) | INTRAVENOUS | Status: DC | PRN
  Administered 2021-06-11: 500 mg via INTRAVENOUS

## 2021-06-11 MED ORDER — FLEET ENEMA 7-19 GM/118ML RE ENEM: 1.0000 | ENEMA | Freq: Once | RECTAL | Status: DC | PRN

## 2021-06-11 MED ORDER — SODIUM CHLORIDE 0.9 % IV SOLN: INTRAVENOUS | Status: DC

## 2021-06-11 MED ORDER — CEFAZOLIN SODIUM-DEXTROSE 2-4 GM/100ML-% IV SOLN
2.0000 g | INTRAVENOUS | Status: AC
  Administered 2021-06-11: 2 g via INTRAVENOUS
  Filled 2021-06-11: qty 100

## 2021-06-11 MED ORDER — POLYETHYLENE GLYCOL 3350 17 G PO PACK: 17.0000 g | PACK | Freq: Every day | ORAL | Status: DC | PRN

## 2021-06-11 MED ORDER — METHOCARBAMOL 500 MG IVPB - SIMPLE MED
INTRAVENOUS | Status: AC
  Filled 2021-06-11: qty 50

## 2021-06-11 MED ORDER — MIDAZOLAM HCL 2 MG/2ML IJ SOLN
0.5000 mg | Freq: Once | INTRAMUSCULAR | Status: AC
  Administered 2021-06-11: 0.5 mg via INTRAVENOUS

## 2021-06-11 MED ORDER — PROMETHAZINE HCL 25 MG PO TABS: 12.5000 mg | ORAL_TABLET | Freq: Four times a day (QID) | ORAL | Status: DC | PRN

## 2021-06-11 MED ORDER — BUPIVACAINE LIPOSOME 1.3 % IJ SUSP
INTRAMUSCULAR | Status: AC
  Filled 2021-06-11: qty 20

## 2021-06-11 SURGICAL SUPPLY — 61 items
ATTUNE PSRP INSR SZ4 12 KNEE (Insert) ×1 IMPLANT
BAG COUNTER SPONGE SURGICOUNT (BAG) IMPLANT
BAG DECANTER FOR FLEXI CONT (MISCELLANEOUS) ×2 IMPLANT
BAG ZIPLOCK 12X15 (MISCELLANEOUS) IMPLANT
BLADE SAG 18X100X1.27 (BLADE) ×1 IMPLANT
BLADE SAW SGTL 11.0X1.19X90.0M (BLADE) ×1 IMPLANT
BLADE SURG SZ10 CARB STEEL (BLADE) IMPLANT
BNDG ELASTIC 6X5.8 VLCR STR LF (GAUZE/BANDAGES/DRESSINGS) ×2 IMPLANT
COVER SURGICAL LIGHT HANDLE (MISCELLANEOUS) ×2 IMPLANT
CUFF TOURN SGL QUICK 34 (TOURNIQUET CUFF) ×2
CUFF TRNQT CYL 34X4.125X (TOURNIQUET CUFF) ×1 IMPLANT
DRAPE INCISE IOBAN 66X45 STRL (DRAPES) ×2 IMPLANT
DRAPE U-SHAPE 47X51 STRL (DRAPES) ×2 IMPLANT
DRSG ADAPTIC 3X8 NADH LF (GAUZE/BANDAGES/DRESSINGS) ×2 IMPLANT
DRSG AQUACEL AG ADV 3.5X10 (GAUZE/BANDAGES/DRESSINGS) ×1 IMPLANT
DRSG PAD ABDOMINAL 8X10 ST (GAUZE/BANDAGES/DRESSINGS) ×2 IMPLANT
DURAPREP 26ML APPLICATOR (WOUND CARE) ×2 IMPLANT
ELECT REM PT RETURN 15FT ADLT (MISCELLANEOUS) ×2 IMPLANT
EVACUATOR 1/8 PVC DRAIN (DRAIN) IMPLANT
GAUZE SPONGE 4X4 12PLY STRL (GAUZE/BANDAGES/DRESSINGS) ×2 IMPLANT
GLOVE BIO SURGEON STRL SZ 6.5 (GLOVE) ×4 IMPLANT
GLOVE BIO SURGEON STRL SZ7 (GLOVE) ×2 IMPLANT
GLOVE BIO SURGEON STRL SZ8 (GLOVE) ×4 IMPLANT
GLOVE BIOGEL PI IND STRL 7.0 (GLOVE) ×1 IMPLANT
GLOVE BIOGEL PI IND STRL 8 (GLOVE) ×1 IMPLANT
GLOVE BIOGEL PI IND STRL 8.5 (GLOVE) IMPLANT
GLOVE BIOGEL PI INDICATOR 7.0 (GLOVE) ×1
GLOVE BIOGEL PI INDICATOR 8 (GLOVE) ×1
GLOVE BIOGEL PI INDICATOR 8.5 (GLOVE)
GOWN SPEC L4 XLG W/TWL (GOWN DISPOSABLE) ×3 IMPLANT
GOWN STRL REUS W/ TWL LRG LVL3 (GOWN DISPOSABLE) ×2 IMPLANT
GOWN STRL REUS W/TWL LRG LVL3 (GOWN DISPOSABLE) ×4
HANDPIECE INTERPULSE COAX TIP (DISPOSABLE) ×2
HOLDER FOLEY CATH W/STRAP (MISCELLANEOUS) IMPLANT
IMMOBILIZER KNEE 20 (SOFTGOODS) ×2
IMMOBILIZER KNEE 20 THIGH 36 (SOFTGOODS) ×1 IMPLANT
KIT TURNOVER KIT A (KITS) IMPLANT
MANIFOLD NEPTUNE II (INSTRUMENTS) ×2 IMPLANT
NS IRRIG 1000ML POUR BTL (IV SOLUTION) ×2 IMPLANT
PACK TOTAL KNEE CUSTOM (KITS) ×2 IMPLANT
PADDING CAST ABS 6INX4YD NS (CAST SUPPLIES) ×1
PADDING CAST ABS COTTON 6X4 NS (CAST SUPPLIES) IMPLANT
PADDING CAST COTTON 6X4 STRL (CAST SUPPLIES) ×4 IMPLANT
PROTECTOR NERVE ULNAR (MISCELLANEOUS) ×2 IMPLANT
SET HNDPC FAN SPRY TIP SCT (DISPOSABLE) ×1 IMPLANT
SPIKE FLUID TRANSFER (MISCELLANEOUS) IMPLANT
STRIP CLOSURE SKIN 1/2X4 (GAUZE/BANDAGES/DRESSINGS) ×3 IMPLANT
SUT MNCRL AB 4-0 PS2 18 (SUTURE) ×2 IMPLANT
SUT STRATAFIX 0 PDS 27 VIOLET (SUTURE) ×2
SUT VIC AB 2-0 CT1 27 (SUTURE) ×6
SUT VIC AB 2-0 CT1 TAPERPNT 27 (SUTURE) ×3 IMPLANT
SUTURE STRATFX 0 PDS 27 VIOLET (SUTURE) ×1 IMPLANT
SWAB COLLECTION DEVICE MRSA (MISCELLANEOUS) IMPLANT
SWAB CULTURE ESWAB REG 1ML (MISCELLANEOUS) IMPLANT
SYR 50ML LL SCALE MARK (SYRINGE) ×4 IMPLANT
TOWER CARTRIDGE SMART MIX (DISPOSABLE) ×1 IMPLANT
TRAY FOLEY MTR SLVR 16FR STAT (SET/KITS/TRAYS/PACK) ×2 IMPLANT
TUBE KAMVAC SUCTION (TUBING) IMPLANT
TUBE SUCTION HIGH CAP CLEAR NV (SUCTIONS) ×2 IMPLANT
WATER STERILE IRR 1000ML POUR (IV SOLUTION) ×2 IMPLANT
WRAP KNEE MAXI GEL POST OP (GAUZE/BANDAGES/DRESSINGS) ×1 IMPLANT

## 2021-06-11 NOTE — Op Note (Unsigned)
NAME: Belinda Norman, Belinda Norman MEDICAL RECORD NO: [id number] ACCOUNT NO: [id number] DATE OF BIRTH: [birth year] FACILITY: [facility] LOCATION: [facility] PHYSICIAN: Dione Plover. Lakeena Downie, MD  Operative Report   DATE OF PROCEDURE: 06/11/2021  PREOPERATIVE DIAGNOSIS:  Unstable right total knee arthroplasty.  POSTOPERATIVE DIAGNOSIS:  Unstable right total knee arthroplasty.  PROCEDURE:  Right knee polyethylene revision.  SURGEON:  Dione Plover. Maryjean Corpening, MD  ASSISTANT:  Molli Barrows, PA-C.  ANESTHESIA:  Adductor canal block and general.  ESTIMATED BLOOD LOSS:  25 mL  DRAINS:  None.  TOURNIQUET TIME:  20 minutes at 300 mmHg.  COMPLICATIONS:  None.  CONDITION:  Stable to recovery.  BRIEF CLINICAL NOTE:  The patient is a 61 year old female who had a right total knee arthroplasty done several years ago at an outside institution.  She has had progressively worsening pain and dysfunction with her knee.  Her exam was consistent with an  unstable total knee.  She had an infection workup, which was negative.  She had a bone scan, which was negative.  She wore a brace, which improved the stability and made her feel a lot better.  She presents now for the knee revision of total knee versus  just polyethylene revision.  DESCRIPTION OF PROCEDURE:  After successful administration of adductor canal block and general anesthetic, tourniquet was placed high on her right thigh.  An exam under anesthesia performed.  She had significant varus and valgus laxity in full extension,  30 degrees of flexion and significant AP laxity in 90 degrees of flexion.  Right lower extremity was then prepped and draped in the usual sterile fashion.  Extremity was wrapped in Esmarch and tourniquet inflated to 300 mmHg.  Midline incision was made  with a 10 blade through subcutaneous tissue to the extensor mechanism.  A fresh blade was used to make a medial parapatellar arthrotomy.  Soft tissue on the proximal medial tibia subperiosteally elevated  to the joint line with a knife and into the  semimembranosus bursa with a Cobb elevator.  Soft tissue laterally was elevated with attention being paid to avoid the patellar tendon on tibial tubercle.  The patella was everted and knee flexed 90 degrees.  I did remove the rest of the scar from the  undersurface of the extensor mechanism and also under the medial soft tissues and the medial gutter.  The scar was also excised from the infrapatellar area.  Once the patella was everted and knee flexed 90 degrees we were able to sublux the tibia forward  and remove the tibial polyethylene.  This was a Sigma rotating platform size 4 polyethylene, which was 5 mm thick.  We placed trials first at a 10 mm, which still had some laxity and a 12 mm full extension was achieved with excellent varus, valgus, and  anterior, posterior balance throughout full range of motion.  We removed the trial and I inspected the interface between the tibial component and bone and the femoral component and bone.  Both the implants were well positioned and well fixed with no loosening.  We then placed the permanent 12 mm posterior  stabilized rotating platform insert for the size 4.  The knee was reduced with the same outstanding stability parameters.  Wound was copiously irrigated with saline solution.  The arthrotomy was then closed with a running 0 Stratafix suture.  Tourniquet  was released, total time of 20 minutes.  Subcutaneous was closed with interrupted 2-0 Vicryl and subcuticular running 4-0 Monocryl.  Incisions cleaned and dried and  Steri-Strips and a sterile dressing were applied.  She subsequently awakened and  transported to recovery in stable condition.   PUS D: 06/11/2021 1:31:45 pm T: 06/11/2021 2:38:00 pm  JOB: [id number]/ [id number]

## 2021-06-11 NOTE — Brief Op Note (Signed)
06/11/2021  1:23 PM  PATIENT:  Belinda Norman  61 y.o. female  PRE-OPERATIVE DIAGNOSIS:  unstable right total knee arthroplasty  POST-OPERATIVE DIAGNOSIS:  Unstable right Total Knee Arthroplasty  PROCEDURE:  Right knee polyethylene revision  SURGEON:  Surgeon(s) and Role:    Gaynelle Arabian, MD - Primary  PHYSICIAN ASSISTANT:   ASSISTANTS: Molli Barrows, PA-C   ANESTHESIA:   regional and general  EBL:  25 ml  BLOOD ADMINISTERED:none  DRAINS: none   LOCAL MEDICATIONS USED:  MARCAINE     COUNTS:  YES  TOURNIQUET: 20 minutes @ 300 mm Hg  DICTATION: .Other Dictation: Dictation Number [id number]  PLAN OF CARE: Admit for overnight observation  PATIENT DISPOSITION:  PACU - hemodynamically stable.

## 2021-06-11 NOTE — Discharge Instructions (Addendum)
Frank Aluisio, MD Total Joint Specialist [facility] [address] [place], [place] [address] [phone]  ANTERIOR APPROACH TOTAL HIP REPLACEMENT POSTOPERATIVE DIRECTIONS     Hip Rehabilitation, Guidelines Following Surgery  The results of a hip operation are greatly improved after range of motion and muscle strengthening exercises. Follow all safety measures which are given to protect your hip. If any of these exercises cause increased pain or swelling in your joint, decrease the amount until you are comfortable again. Then slowly increase the exercises. Call your caregiver if you have problems or questions.   BLOOD CLOT PREVENTION Take a 10 mg Xarelto once a day for three weeks following surgery. Then take an 81 mg Aspirin once a day for three weeks. Then discontinue Aspirin. You may resume your vitamins/supplements once you have discontinued the Xarelto. Do not take any NSAIDs (Advil, Aleve, Ibuprofen, Meloxicam, etc.) until you have discontinued the Xarelto.   HOME CARE INSTRUCTIONS  Remove items at home which could result in a fall. This includes throw rugs or furniture in walking pathways.  ICE to the affected hip as frequently as 20-30 minutes an hour and then as needed for pain and swelling. Continue to use ice on the hip for pain and swelling from surgery. You may notice swelling that will progress down to the foot and ankle. This is normal after surgery. Elevate the leg when you are not up walking on it.   Continue to use the breathing machine which will help keep your temperature down.  It is common for your temperature to cycle up and down following surgery, especially at night when you are not up moving around and exerting yourself.  The breathing machine keeps your lungs expanded and your temperature down.  DIET You may resume your previous home diet once your are discharged from the hospital.  DRESSING / WOUND CARE / SHOWERING You have an  adhesive waterproof bandage over the incision. Leave this in place until your first follow-up appointment. Once you remove this you will not need to place another bandage.  You may begin showering 3 days following surgery, but do not submerge the incision under water.  ACTIVITY For the first 3-5 days, it is important to rest and keep the operative leg elevated. You should, as a general rule, rest for 50 minutes and walk/stretch for 10 minutes per hour. After 5 days, you may slowly increase activity as tolerated.  Perform the exercises you were provided twice a day for about 15-20 minutes each session. Begin these 2 days following surgery. Walk with your walker as instructed. Use the walker until you are comfortable transitioning to a cane. Walk with the cane in the opposite hand of the operative leg. You may discontinue the cane once you are comfortable and walking steadily. Avoid periods of inactivity such as sitting longer than an hour when not asleep. This helps prevent blood clots.  Do not drive a car for 6 weeks or until released by your surgeon.  Do not drive while taking narcotics.  TED HOSE STOCKINGS Wear the elastic stockings on both legs for three weeks following surgery during the day. You may remove them at night while sleeping.  WEIGHT BEARING Weight bearing as tolerated with assist device (walker, cane, etc) as directed, use it as long as suggested by your surgeon or therapist, typically at least 4-6 weeks.  POSTOPERATIVE CONSTIPATION PROTOCOL Constipation - defined medically as fewer than three stools per week and severe constipation as less   than one stool per week.  One of the most common issues patients have following surgery is constipation.  Even if you have a regular bowel pattern at home, your normal regimen is likely to be disrupted due to multiple reasons following surgery.  Combination of anesthesia, postoperative narcotics, change in appetite and fluid intake all can  affect your bowels.  In order to avoid complications following surgery, here are some recommendations in order to help you during your recovery period.  Colace (docusate) - Pick up an over-the-counter form of Colace or another stool softener and take twice a day as long as you are requiring postoperative pain medications.  Take with a full glass of water daily.  If you experience loose stools or diarrhea, hold the colace until you stool forms back up.  If your symptoms do not get better within 1 week or if they get worse, check with your doctor. Dulcolax (bisacodyl) - Pick up over-the-counter and take as directed by the product packaging as needed to assist with the movement of your bowels.  Take with a full glass of water.  Use this product as needed if not relieved by Colace only.  MiraLax (polyethylene glycol) - Pick up over-the-counter to have on hand.  MiraLax is a solution that will increase the amount of water in your bowels to assist with bowel movements.  Take as directed and can mix with a glass of water, juice, soda, coffee, or tea.  Take if you go more than two days without a movement.Do not use MiraLax more than once per day. Call your doctor if you are still constipated or irregular after using this medication for 7 days in a row.  If you continue to have problems with postoperative constipation, please contact the office for further assistance and recommendations.  If you experience "the worst abdominal pain ever" or develop nausea or vomiting, please contact the office immediatly for further recommendations for treatment.  ITCHING  If you experience itching with your medications, try taking only a single pain pill, or even half a pain pill at a time.  You can also use Benadryl over the counter for itching or also to help with sleep.   MEDICATIONS See your medication summary on the "After Visit Summary" that the nursing staff will review with you prior to discharge.  You may have some home  medications which will be placed on hold until you complete the course of blood thinner medication.  It is important for you to complete the blood thinner medication as prescribed by your surgeon.  Continue your approved medications as instructed at time of discharge.  Information on my medicine - XARELTO (Rivaroxaban)  Why was Xarelto prescribed for you? Xarelto was prescribed for you to reduce the risk of blood clots forming after orthopedic surgery. The medical term for these abnormal blood clots is venous thromboembolism (VTE).  What do you need to know about xarelto ? Take your Xarelto ONCE DAILY at the same time every day. You may take it either with or without food.  If you have difficulty swallowing the tablet whole, you may crush it and mix in applesauce just prior to taking your dose.  Take Xarelto exactly as prescribed by your doctor and DO NOT stop taking Xarelto without talking to the doctor who prescribed the medication.  Stopping without other VTE prevention medication to take the place of Xarelto may increase your risk of developing a clot.  After discharge, you should have regular check-up appointments  with your healthcare provider that is prescribing your Xarelto.    What do you do if you miss a dose? If you miss a dose, take it as soon as you remember on the same day then continue your regularly scheduled once daily regimen the next day. Do not take two doses of Xarelto on the same day.   Important Safety Information A possible side effect of Xarelto is bleeding. You should call your healthcare provider right away if you experience any of the following: Bleeding from an injury or your nose that does not stop. Unusual colored urine (red or dark brown) or unusual colored stools (red or black). Unusual bruising for unknown reasons. A serious fall or if you hit your head (even if there is no bleeding).  Some medicines may interact with Xarelto and might increase  your risk of bleeding while on Xarelto. To help avoid this, consult your healthcare provider or pharmacist prior to using any new prescription or non-prescription medications, including herbals, vitamins, non-steroidal anti-inflammatory drugs (NSAIDs) and supplements.  This website has more information on Xarelto: [email or web address].[email or web address]    PRECAUTIONS If you experience chest pain or shortness of breath - call 911 immediately for transfer to the hospital emergency department.  If you develop a fever greater that 101 F, purulent drainage from wound, increased redness or drainage from wound, foul odor from the wound/dressing, or calf pain - CONTACT YOUR SURGEON.                                                   FOLLOW-UP APPOINTMENTS Make sure you keep all of your appointments after your operation with your surgeon and caregivers. You should call the office at the above phone number and make an appointment for approximately two weeks after the date of your surgery or on the date instructed by your surgeon outlined in the "After Visit Summary".  RANGE OF MOTION AND STRENGTHENING EXERCISES  These exercises are designed to help you keep full movement of your hip joint. Follow your caregiver's or physical therapist's instructions. Perform all exercises about fifteen times, three times per day or as directed. Exercise both hips, even if you have had only one joint replacement. These exercises can be done on a training (exercise) mat, on the floor, on a table or on a bed. Use whatever works the best and is most comfortable for you. Use music or television while you are exercising so that the exercises are a pleasant break in your day. This will make your life better with the exercises acting as a break in routine you can look forward to.  Lying on your back, slowly slide your foot toward your buttocks, raising your knee up off the floor. Then slowly slide your foot back down until your leg is straight again.   Lying on your back spread your legs as far apart as you can without causing discomfort.  Lying on your side, raise your upper leg and foot straight up from the floor as far as is comfortable. Slowly lower the leg and repeat.  Lying on your back, tighten up the muscle in the front of your thigh (quadriceps muscles). You can do this by keeping your leg straight and trying to raise your heel off the floor. This helps strengthen the largest muscle supporting your knee.  Lying on your back, tighten  up the muscles of your buttocks both with the legs straight and with the knee bent at a comfortable angle while keeping your heel on the floor.   POST-OPERATIVE OPIOID TAPER INSTRUCTIONS: It is important to wean off of your opioid medication as soon as possible. If you do not need pain medication after your surgery it is ok to stop day one. Opioids include: Codeine, Hydrocodone(Norco, Vicodin), Oxycodone(Percocet, oxycontin) and hydromorphone amongst others.  Long term and even short term use of opiods can cause: Increased pain response Dependence Constipation Depression Respiratory depression And more.  Withdrawal symptoms can include Flu like symptoms Nausea, vomiting And more Techniques to manage these symptoms Hydrate well Eat regular healthy meals Stay active Use relaxation techniques(deep breathing, meditating, yoga) Do Not substitute Alcohol to help with tapering If you have been on opioids for less than two weeks and do not have pain than it is ok to stop all together.  Plan to wean off of opioids This plan should start within one week post op of your joint replacement. Maintain the same interval or time between taking each dose and first decrease the dose.  Cut the total daily intake of opioids by one tablet each day Next start to increase the time between doses. The last dose that should be eliminated is the evening dose.   IF YOU ARE TRANSFERRED TO A SKILLED REHAB FACILITY If the  patient is transferred to a skilled rehab facility following release from the hospital, a list of the current medications will be sent to the facility for the patient to continue.  When discharged from the skilled rehab facility, please have the facility set up the patient's [place] prior to being released. Also, the skilled facility will be responsible for providing the patient with their medications at time of release from the facility to include their pain medication, the muscle relaxants, and their blood thinner medication. If the patient is still at the rehab facility at time of the two week follow up appointment, the skilled rehab facility will also need to assist the patient in arranging follow up appointment in our office and any transportation needs.  MAKE SURE YOU:  Understand these instructions.  Get help right away if you are not doing well or get worse.    DENTAL ANTIBIOTICS:  In most cases prophylactic antibiotics for Dental procdeures after total joint surgery are not necessary.  Exceptions are as follows:  1. History of prior total joint infection  2. Severely immunocompromised (Organ Transplant, cancer chemotherapy, Rheumatoid biologic meds such as [place])  3. Poorly controlled diabetes (A1C &gt; 8.0, blood glucose over 200)  If you have one of these conditions, contact your surgeon for an antibiotic prescription, prior to your dental procedure.    Pick up stool softner and laxative for home use following surgery while on pain medications. Do not submerge incision under water. Please use good hand washing techniques while changing dressing each day. May shower starting three days after surgery. Please use a clean towel to pat the incision dry following showers. Continue to use ice for pain and swelling after surgery. Do not use any lotions or creams on the incision until instructed by your surgeon.

## 2021-06-11 NOTE — Anesthesia Procedure Notes (Signed)
Procedure Name: Intubation Date/Time: 06/11/2021 1:00 PM Performed by: Lavina Hamman, CRNA Pre-anesthesia Checklist: Patient identified, Emergency Drugs available, Suction available and Patient being monitored Patient Re-evaluated:Patient Re-evaluated prior to induction Oxygen Delivery Method: Circle System Utilized Preoxygenation: Pre-oxygenation with 100% oxygen Induction Type: IV induction Ventilation: Mask ventilation without difficulty Laryngoscope Size: Mac and 3 Grade View: Grade I Tube type: Oral Tube size: 7.0 mm Number of attempts: 1 Airway Equipment and Method: Stylet and Oral airway Placement Confirmation: ETT inserted through vocal cords under direct vision, positive ETCO2 and breath sounds checked- equal and bilateral Secured at: 22 cm Tube secured with: Tape Dental Injury: Teeth and Oropharynx as per pre-operative assessment  Comments: ATOI

## 2021-06-11 NOTE — Progress Notes (Signed)
PT Cancellation Note  Patient Details Name: Belinda Norman MRN: [id number] DOB: November 02, 1960   Cancelled Treatment:    Reason Eval/Treat Not Completed: Medical issues which prohibited therapy (Pt given ativan), pt not participatory, RN requesting hold therapy. We will follow up as pt status and schedule allows which will likely be tomorrow.  Coolidge Breeze, PT, DPT [facility] Office: [phone] Pager: [phone]  Coolidge Breeze 06/11/2021, 4:16 PM

## 2021-06-11 NOTE — Anesthesia Postprocedure Evaluation (Signed)
Anesthesia Post Note  Patient: Belinda Norman  Procedure(s) Performed: Right knee polyethylene versus total knee arthroplasty revision (Right: Knee)     Patient location during evaluation: PACU Anesthesia Type: General Level of consciousness: awake Pain management: pain level controlled Respiratory status: spontaneous breathing Cardiovascular status: stable Postop Assessment: no apparent nausea or vomiting Anesthetic complications: no   No notable events documented.  Last Vitals:  Vitals:   06/11/21 1445 06/11/21 1500  BP: 114/66 122/76  Pulse: 80 82  Resp: 15 16  Temp:  36.7 C  SpO2: 100% 95%    Last Pain:  Vitals:   06/11/21 1506  TempSrc:   PainSc: 10-Worst pain ever                 Jasey Cortez

## 2021-06-11 NOTE — Transfer of Care (Signed)
Immediate Anesthesia Transfer of Care Note  Patient: Belinda Norman  Procedure(s) Performed: Procedure(s): Right knee polyethylene versus total knee arthroplasty revision (Right)  Patient Location: PACU  Anesthesia Type:General  Level of Consciousness:  sedated, patient cooperative and responds to stimulation  Airway & Oxygen Therapy:Patient Spontanous Breathing and Patient connected to face mask oxgen  Post-op Assessment:  Report given to PACU RN and Post -op Vital signs reviewed and stable  Post vital signs:  Reviewed and stable  Last Vitals:  Vitals:   06/11/21 1152 06/11/21 1201  BP:    Pulse: 93 89  Resp: (!) 22 18  Temp:    SpO2: 706% 237%    Complications: No apparent anesthesia complications

## 2021-06-11 NOTE — Anesthesia Preprocedure Evaluation (Addendum)
Anesthesia Evaluation  Patient identified by MRN, date of birth, ID band  Reviewed: Allergy & Precautions, NPO status , Patient's Chart, lab work & pertinent test results  Airway Mallampati: II  TM Distance: >3 FB     Dental   Pulmonary neg pulmonary ROS,    breath sounds clear to auscultation       Cardiovascular negative cardio ROS   Rhythm:Regular Rate:Normal     Neuro/Psych PSYCHIATRIC DISORDERS negative neurological ROS     GI/Hepatic GERD  ,(+) Hepatitis -  Endo/Other  negative endocrine ROS  Renal/GU negative Renal ROS     Musculoskeletal  (+) Arthritis ,   Abdominal   Peds  Hematology   Anesthesia Other Findings   Reproductive/Obstetrics                             Anesthesia Physical Anesthesia Plan  ASA: 3  Anesthesia Plan: General and Regional   Post-op Pain Management:    Induction: Intravenous  PONV Risk Score and Plan: 3 and Ondansetron, Dexamethasone and Midazolam  Airway Management Planned: Oral ETT  Additional Equipment:   Intra-op Plan:   Post-operative Plan: Extubation in OR  Informed Consent: I have reviewed the patients History and Physical, chart, labs and discussed the procedure including the risks, benefits and alternatives for the proposed anesthesia with the patient or authorized representative who has indicated his/her understanding and acceptance.     Dental advisory given  Plan Discussed with: CRNA and Anesthesiologist  Anesthesia Plan Comments:        Anesthesia Quick Evaluation

## 2021-06-11 NOTE — Anesthesia Procedure Notes (Signed)
Anesthesia Regional Block: Adductor canal block   Pre-Anesthetic Checklist: , timeout performed,  Correct Patient, Correct Site, Correct Laterality,  Correct Procedure, Correct Position, site marked,  Risks and benefits discussed,  Surgical consent,  Pre-op evaluation,  At surgeon's request and post-op pain management  Laterality: Right  Prep: chloraprep       Needles:   Needle Type: Echogenic Stimulator Needle          Additional Needles:   Procedures: Doppler guided,,,, ultrasound used (permanent image in chart),,    Narrative:  Start time: 06/11/2021 11:40 AM End time: 06/11/2021 11:55 AM Injection made incrementally with aspirations every 5 mL. Anesthesiologist: Belinda Block, MD

## 2021-06-11 NOTE — Interval H&P Note (Signed)
History and Physical Interval Note:  06/11/2021 11:20 AM  Belinda Norman  has presented today for surgery, with the diagnosis of unstable right total knee arthroplasty.  The various methods of treatment have been discussed with the patient and family. After consideration of risks, benefits and other options for treatment, the patient has consented to  Procedure(s): Right knee polyethylene versus total knee arthroplasty revision (Right) as a surgical intervention.  The patient's history has been reviewed, patient examined, no change in status, stable for surgery.  I have reviewed the patient's chart and labs.  Questions were answered to the patient's satisfaction.     Pilar Plate Ailyn Gladd

## 2021-06-11 NOTE — Progress Notes (Signed)
AssistedDr. Charlene Green with right, adductor canal, ultrasound guided block. Side rails up, monitors on throughout procedure. See vital signs in flow sheet. Tolerated Procedure well.  

## 2021-06-11 NOTE — Plan of Care (Signed)
  Problem: Clinical Measurements: Goal: Ability to maintain clinical measurements within normal limits will improve Outcome: Progressing   Problem: Activity: Goal: Risk for activity intolerance will decrease Outcome: Progressing   Problem: Pain Managment: Goal: General experience of comfort will improve Outcome: Progressing   Problem: Coping: Goal: Level of anxiety will decrease Outcome: Progressing   Problem: Safety: Goal: Ability to remain free from injury will improve Outcome: Progressing

## 2021-06-12 ENCOUNTER — Encounter (HOSPITAL_COMMUNITY): Payer: Self-pay | Admitting: Orthopedic Surgery

## 2021-06-12 DIAGNOSIS — T84022A Instability of internal right knee prosthesis, initial encounter: Secondary | ICD-10-CM | POA: Diagnosis not present

## 2021-06-12 LAB — CBC
HCT: 37.4 % (ref 36.0–46.0)
Hemoglobin: 12.4 g/dL (ref 12.0–15.0)
MCH: 32.5 pg (ref 26.0–34.0)
MCHC: 33.2 g/dL (ref 30.0–36.0)
MCV: 97.9 fL (ref 80.0–100.0)
Platelets: 171 10*3/uL (ref 150–400)
RBC: 3.82 MIL/uL — ABNORMAL LOW (ref 3.87–5.11)
RDW: 11.7 % (ref 11.5–15.5)
WBC: 8.2 10*3/uL (ref 4.0–10.5)
nRBC: 0 % (ref 0.0–0.2)

## 2021-06-12 LAB — BASIC METABOLIC PANEL
Anion gap: 8 (ref 5–15)
BUN: 8 mg/dL (ref 6–20)
CO2: 24 mmol/L (ref 22–32)
Calcium: 9.1 mg/dL (ref 8.9–10.3)
Chloride: 110 mmol/L (ref 98–111)
Creatinine, Ser: 0.74 mg/dL (ref 0.44–1.00)
GFR, Estimated: >60 mL/min (ref >60)
Glucose, Bld: 135 mg/dL — ABNORMAL HIGH (ref 70–99)
Potassium: 4.5 mmol/L (ref 3.5–5.1)
Sodium: 142 mmol/L (ref 135–145)

## 2021-06-12 MED ORDER — HYDROMORPHONE HCL 2 MG PO TABS: 2.0000 mg | ORAL_TABLET | Freq: Four times a day (QID) | ORAL | 0 refills | Status: AC | PRN

## 2021-06-12 MED ORDER — RIVAROXABAN 10 MG PO TABS: 10.0000 mg | ORAL_TABLET | Freq: Every day | ORAL | 0 refills | Status: AC

## 2021-06-12 MED ORDER — METHOCARBAMOL 500 MG PO TABS: 500.0000 mg | ORAL_TABLET | Freq: Four times a day (QID) | ORAL | 0 refills | Status: AC | PRN

## 2021-06-12 NOTE — Plan of Care (Signed)

## 2021-06-12 NOTE — Progress Notes (Signed)
   Subjective: 1 Day Post-Op Procedure(s) (LRB): Right knee polyethylene revision  (Right) Patient reports pain as moderate.   Patient seen in rounds by Dr. Wynelle Link. Patient had issues with pain and anxiety yesterday. Given ativan with some improvement. Discussed with nurse yesterday that patient is already on high dose of dilaudid and we cannot increase regimen from there. Denies chest pain or SOB. Voiding without difficulty. We will begin therapy today.   Objective: Vital signs in last 24 hours: Temp:  [97.6 F (36.4 C)-98.2 F (36.8 C)] 97.7 F (36.5 C) (06/01 0550) Pulse Rate:  [75-100] 76 (06/01 0550) Resp:  [11-22] 18 (06/01 0550) BP: (101-139)/(66-89) 102/66 (06/01 0550) SpO2:  [92 %-100 %] 100 % (06/01 0550) Weight:  [75.5 kg] 75.5 kg (05/31 1030)  Intake/Output from previous day:  Intake/Output Summary (Last 24 hours) at 06/12/2021 0737 Last data filed at 06/12/2021 0600 Gross per 24 hour  Intake 2781.81 ml  Output 100 ml  Net 2681.81 ml     Intake/Output this shift: No intake/output data recorded.  Labs: Recent Labs    06/12/21 0337  HGB 12.4   Recent Labs    06/12/21 0337  WBC 8.2  RBC 3.82*  HCT 37.4  PLT 171   Recent Labs    06/12/21 0337  NA 142  K 4.5  CL 110  CO2 24  BUN 8  CREATININE 0.74  GLUCOSE 135*  CALCIUM 9.1   No results for input(s): LABPT, INR in the last 72 hours.  Exam: General - Patient is Alert and Oriented Extremity - Neurologically intact Neurovascular intact Sensation intact distally Dorsiflexion/Plantar flexion intact Dressing - dressing C/D/I Motor Function - intact, moving foot and toes well on exam.   Past Medical History:  Diagnosis Date   Anxiety    Arthritis    Cancer ([place])    breast cancer. 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   Weight as of this encounter: 75.5 kg. Advance diet Up with therapy D/C IV fluids  DVT Prophylaxis - Xarelto Weight bearing as tolerated. Begin therapy.  Plan is to go Home after hospital stay. Plan for discharge today with OPPT at [facility]). Follow-up in the office in 2 weeks.   The PDMP database was reviewed today prior to any opioid medications being prescribed to this patient.  Theresa Duty, PA-C Orthopedic Surgery [phone] 06/12/2021, 7:37 AM

## 2021-06-12 NOTE — TOC Transition Note (Signed)
Transition of Care [facility]) - CM/SW Discharge Note  Patient Details  Name: Belinda Norman MRN: [id number] Date of Birth: 04-Nov-1960  Transition of Care [facility]) CM/SW Contact:  Sherie Don, LCSW Phone Number: 06/12/2021, 10:07 AM  Clinical Narrative: Patient is expected to discharge home after working with PT. CSW met with patient to confirm discharge plan. Patient will go home with OPPT at [facility] in [place]. Patient has a rolling walker, shower chair, crutches, and elevated toilet at home so there are no DME needs at this time. TOC signing off.  Final next level of care: OP Rehab Barriers to Discharge: No Barriers Identified  Patient Goals and CMS Choice Patient states their goals for this hospitalization and ongoing recovery are:: Discharge home with OPPT at [facility] in [place] Choice offered to / list presented to : NA  Discharge Plan and Services        DME Arranged: N/A DME Agency: NA  Readmission Risk Interventions     View : No data to display.

## 2021-06-12 NOTE — Evaluation (Signed)
Physical Therapy Evaluation Patient Details Name: Belinda Norman MRN: [id number] DOB: 1960/04/17 Today's Date: 06/12/2021  History of Present Illness  Pt is a 61yo female presenting s/p R knee polyethylene revision on 06/11/21 PMH: OA, anxiety&depression, hx of breast cancer s/p bilateral mastectomy, GERD, R-TKA 2020.  Clinical Impression  Pt ambulated 100' with RW, no loss of balance. Instructed pt and sister who will be caregiver in TKA HEP. Stair training completed. She is ready to DC home from a PT standpoint.  Pt reports she has outpatient PT scheduled for next week.      Recommendations for follow up therapy are one component of a multi-disciplinary discharge planning process, led by the attending physician.  Recommendations may be updated based on patient status, additional functional criteria and insurance authorization.  Follow Up Recommendations Outpatient PT    Assistance Recommended at Discharge Intermittent Supervision/Assistance  Patient can return home with the following  A little help with bathing/dressing/bathroom;Assist for transportation;Help with stairs or ramp for entrance;Assistance with cooking/housework    Equipment Recommendations None recommended by PT  Recommendations for Other Services       Functional Status Assessment Patient has had a recent decline in their functional status and demonstrates the ability to make significant improvements in function in a reasonable and predictable amount of time.     Precautions / Restrictions Precautions Precautions: Knee Precaution Booklet Issued: Yes (comment) Precaution Comments: reviewed no pillow under knee Restrictions Weight Bearing Restrictions: No      Mobility  Bed Mobility Overal bed mobility: Modified Independent             General bed mobility comments: assisted RLE with LLE    Transfers Overall transfer level: Needs assistance Equipment used: Rolling walker (2 wheels) Transfers: Sit  to/from Stand Sit to Stand: Supervision           General transfer comment: VCs hand placement    Ambulation/Gait Ambulation/Gait assistance: Modified independent (Device/Increase time) Gait Distance (Feet): 100 Feet Assistive device: Rolling walker (2 wheels) Gait Pattern/deviations: Step-to pattern Gait velocity: decr     General Gait Details: VCs sequencing, no loss of balance  Stairs Stairs: Yes   Stair Management: One rail Right, Sideways, Step to pattern Number of Stairs: 4 General stair comments: sister present for stair training and will assist with RW; VCs sequencing  Wheelchair Mobility    Modified Rankin (Stroke Patients Only)       Balance Overall balance assessment: Modified Independent                                           Pertinent Vitals/Pain Pain Assessment Pain Assessment: 0-10 Pain Score: 8  Pain Location: R knee with movement Pain Descriptors / Indicators: Grimacing, Guarding Pain Intervention(s): Limited activity within patient's tolerance, Monitored during session, Premedicated before session, Ice applied, Repositioned    Home Living Family/patient expects to be discharged to:: Private residence Living Arrangements: Alone Available Help at Discharge: Family;Available 24 hours/day Type of Home: House Home Access: Stairs to enter Entrance Stairs-Rails: Chemical engineer of Steps: 6   Home Layout: One level Home Equipment: Conservation officer, nature (2 wheels) Additional Comments: sister to stay with pt    Prior Function Prior Level of Function : Independent/Modified Independent             Mobility Comments: wore a medial/lateral support brace on R knee PTA, walked  without AD ADLs Comments: independent     Hand Dominance   Dominant Hand: Right    Extremity/Trunk Assessment   Upper Extremity Assessment Upper Extremity Assessment: Overall WFL for tasks assessed    Lower Extremity  Assessment Lower Extremity Assessment: RLE deficits/detail RLE Deficits / Details: SLR +2/5, knee ext +2/5, knee AAROM 5-45* limited by pain RLE Sensation: WNL RLE Coordination: WNL    Cervical / Trunk Assessment Cervical / Trunk Assessment: Normal  Communication   Communication: No difficulties  Cognition Arousal/Alertness: Awake/alert Behavior During Therapy: WFL for tasks assessed/performed Overall Cognitive Status: Within Functional Limits for tasks assessed                                          General Comments      Exercises Total Joint Exercises Ankle Circles/Pumps: AROM, Both, 10 reps, Supine Quad Sets: AROM, Right, 5 reps, Supine Short Arc Quad: Right, AAROM, 5 reps, Supine Heel Slides: AAROM, Right, 5 reps, Supine Hip ABduction/ADduction: AAROM, Right, 5 reps, Supine Straight Leg Raises: AAROM, Right, 5 reps, Supine Long Arc Quad: AAROM, Right, 5 reps, Seated Knee Flexion: AAROM, Right, 5 reps, Seated Goniometric ROM: 5-45* AAROM R knee   Assessment/Plan    PT Assessment All further PT needs can be met in the next venue of care  PT Problem List Decreased range of motion;Decreased strength;Decreased mobility;Decreased activity tolerance;Pain       PT Treatment Interventions      PT Goals (Current goals can be found in the Care Plan section)  Acute Rehab PT Goals Patient Stated Goal: dancing, shopping without pain PT Goal Formulation: All assessment and education complete, DC therapy    Frequency       Co-evaluation               AM-PAC PT "6 Clicks" Mobility  Outcome Measure Help needed turning from your back to your side while in a flat bed without using bedrails?: None Help needed moving from lying on your back to sitting on the side of a flat bed without using bedrails?: A Little Help needed moving to and from a bed to a chair (including a wheelchair)?: A Little Help needed standing up from a chair using your arms (e.g.,  wheelchair or bedside chair)?: A Little Help needed to walk in hospital room?: A Little Help needed climbing 3-5 steps with a railing? : A Little 6 Click Score: 19    End of Session Equipment Utilized During Treatment: Gait belt Activity Tolerance: Patient tolerated treatment well;Patient limited by pain Patient left: in chair;with call bell/phone within reach;with family/visitor present Nurse Communication: Mobility status PT Visit Diagnosis: Difficulty in walking, not elsewhere classified (R26.2);Pain;Muscle weakness (generalized) (M62.81) Pain - Right/Left: Right Pain - part of body: Knee    Time: 0539-7673 PT Time Calculation (min) (ACUTE ONLY): 41 min   Charges:   PT Evaluation $PT Eval Moderate Complexity: 1 Mod PT Treatments $Gait Training: 8-22 mins $Therapeutic Exercise: 8-22 mins       Blondell Reveal Kistler PT 06/12/2021  Acute Rehabilitation Services Pager [phone] Office [phone]

## 2021-06-16 NOTE — Discharge Summary (Signed)
Patient ID: Belinda Norman MRN: [id number] DOB/AGE: July 07, 1960 61 y.o.  Admit date: 06/11/2021 Discharge date: 06/12/2021  Admission Diagnoses:  Principal Problem:   Failed total knee arthroplasty [facility]) Active Problems:   Failed total right knee replacement [facility])   Discharge Diagnoses:  Same  Past Medical History:  Diagnosis Date   Anxiety    Arthritis    Cancer ([place])    breast cancer. had bilateral mastectomies   DCIS (ductal carcinoma in situ)    Depression    GERD (gastroesophageal reflux disease)    Hepatitis C    cured in 2014   HSV (herpes simplex virus) infection    IBS (irritable bowel syndrome)     Surgeries: Procedure(s): Right knee polyethylene revision  on 06/11/2021   Consultants:   Discharged Condition: Improved  Hospital Course: Belinda Norman is an 61 y.o. female who was admitted 06/11/2021 for operative treatment ofFailed total knee arthroplasty ([place]). Patient has severe unremitting pain that affects sleep, daily activities, and work/hobbies. After pre-op clearance the patient was taken to the operating room on 06/11/2021 and underwent  Procedure(s): Right knee polyethylene revision .    Patient was given perioperative antibiotics:  Anti-infectives (From admission, onward)    Start     Dose/Rate Route Frequency Ordered Stop   06/11/21 1845  ceFAZolin (ANCEF) IVPB 2g/100 mL premix        2 g 200 mL/hr over 30 Minutes Intravenous Every 6 hours 06/11/21 1508 06/12/21 0115   06/11/21 1015  ceFAZolin (ANCEF) IVPB 2g/100 mL premix        2 g 200 mL/hr over 30 Minutes Intravenous On call to O.R. 06/11/21 1005 06/11/21 1240        Patient was given sequential compression devices, early ambulation, and chemoprophylaxis to prevent DVT.  Patient benefited maximally from hospital stay and there were no complications.    Recent vital signs: No data found.   Recent laboratory studies: No results for input(s): WBC, HGB, HCT, PLT, NA, K, CL, CO2, BUN,  CREATININE, GLUCOSE, INR, CALCIUM in the last 72 hours.  Invalid input(s): PT, 2   Discharge Medications:   Allergies as of 06/12/2021       Reactions   Percocet [oxycodone-acetaminophen] Nausea And Vomiting   Severe n & v. Could only take Dilaudid   Zofran [ondansetron Hcl] Other (See Comments)   headaches   Codeine Nausea And Vomiting, Nausea Only   headache   Epinephrine Other (See Comments)   Causes "craziness" with dental cases only   Morphine And Related Nausea Only, Other (See Comments)   Severe headaches During breast surgeries, did not work for pain        Medication List     STOP taking these medications    bisacodyl 5 MG EC tablet Generic drug: bisacodyl   dicyclomine 10 MG capsule Commonly known as: Bentyl   ipratropium 0.06 % nasal spray Commonly known as: ATROVENT   omeprazole 40 MG capsule Commonly known as: PRILOSEC   predniSONE 50 MG tablet Commonly known as: DELTASONE   promethazine-dextromethorphan 6.25-15 MG/5ML syrup Commonly known as: PROMETHAZINE-DM       TAKE these medications    acetaminophen 500 MG tablet Commonly known as: TYLENOL Take 1,000 mg by mouth every 6 (six) hours as needed for moderate pain.   acyclovir 400 MG tablet Commonly known as: ZOVIRAX Take 400 mg by mouth 5 (five) times daily as needed (outbreaks).   AeroChamber MV inhaler Use as instructed   albuterol  108 (90 Base) MCG/ACT inhaler Commonly known as: VENTOLIN HFA Inhale 2 puffs into the lungs every 6 (six) hours as needed for wheezing.   citalopram 40 MG tablet Commonly known as: CELEXA Take 40 mg by mouth daily.   clonazePAM 1 MG tablet Commonly known as: KLONOPIN Take 1 mg by mouth at bedtime as needed for anxiety.   desipramine 25 MG tablet Commonly known as: NORPRAMIN Take 25 mg by mouth at bedtime.   diazepam 5 MG tablet Commonly known as: VALIUM Take 5 mg by mouth as needed for anxiety.   fexofenadine 180 MG tablet Commonly known as:  ALLEGRA Take 180 mg by mouth daily as needed for allergies.   HYDROmorphone 2 MG tablet Commonly known as: DILAUDID Take 1-2 tablets (2-4 mg total) by mouth every 6 (six) hours as needed for severe pain.   methocarbamol 500 MG tablet Commonly known as: ROBAXIN Take 1 tablet (500 mg total) by mouth every 6 (six) hours as needed for muscle spasms.   rivaroxaban 10 MG Tabs tablet Commonly known as: XARELTO Take 1 tablet (10 mg total) by mouth daily with breakfast for 20 days. Then take one 81 mg aspirin once a day for three weeks. Then discontinue aspirin.   Vitamin D3 25 MCG (1000 UT) Caps Take 2,000 Units by mouth daily.               Discharge Care Instructions  (From admission, onward)           Start     Ordered   06/12/21 0000  Weight bearing as tolerated        06/12/21 0743   06/12/21 0000  Change dressing       Comments: You may remove the bulky bandage (ACE wrap and gauze) two days after surgery. You will have an adhesive waterproof bandage underneath. Leave this in place until your first follow-up appointment.   06/12/21 0743            Diagnostic Studies: No results found.  Disposition: Discharge disposition: 01-Home or Self Care       Discharge Instructions     Call MD / Call 911   Complete by: As directed    If you experience chest pain or shortness of breath, CALL 911 and be transported to the hospital emergency room.  If you develope a fever above 101 F, pus (white drainage) or increased drainage or redness at the wound, or calf pain, call your surgeon's office.   Change dressing   Complete by: As directed    You may remove the bulky bandage (ACE wrap and gauze) two days after surgery. You will have an adhesive waterproof bandage underneath. Leave this in place until your first follow-up appointment.   Constipation Prevention   Complete by: As directed    Drink plenty of fluids.  Prune juice may be helpful.  You may use a stool softener,  such as Colace (over the counter) 100 mg twice a day.  Use MiraLax (over the counter) for constipation as needed.   Diet - low sodium heart healthy   Complete by: As directed    Do not put a pillow under the knee. Place it under the heel.   Complete by: As directed    Driving restrictions   Complete by: As directed    No driving for two weeks   Post-operative opioid taper instructions:   Complete by: As directed    POST-OPERATIVE OPIOID TAPER INSTRUCTIONS: It is important  to wean off of your opioid medication as soon as possible. If you do not need pain medication after your surgery it is ok to stop day one. Opioids include: Codeine, Hydrocodone(Norco, Vicodin), Oxycodone(Percocet, oxycontin) and hydromorphone amongst others.  Long term and even short term use of opiods can cause: Increased pain response Dependence Constipation Depression Respiratory depression And more.  Withdrawal symptoms can include Flu like symptoms Nausea, vomiting And more Techniques to manage these symptoms Hydrate well Eat regular healthy meals Stay active Use relaxation techniques(deep breathing, meditating, yoga) Do Not substitute Alcohol to help with tapering If you have been on opioids for less than two weeks and do not have pain than it is ok to stop all together.  Plan to wean off of opioids This plan should start within one week post op of your joint replacement. Maintain the same interval or time between taking each dose and first decrease the dose.  Cut the total daily intake of opioids by one tablet each day Next start to increase the time between doses. The last dose that should be eliminated is the evening dose.      TED hose   Complete by: As directed    Use stockings (TED hose) for three weeks on both leg(s).  You may remove them at night for sleeping.   Weight bearing as tolerated   Complete by: As directed         Follow-up Information     Aluisio, Pilar Plate, MD. Schedule an  appointment as soon as possible for a visit in 2 week(s).   Specialty: Orthopedic Surgery Contact information: [address] [place] [address] [phone]                  Signed: Theresa Duty 06/16/2021, 8:05 AM

## 2021-07-17 ENCOUNTER — Other Ambulatory Visit (HOSPITAL_COMMUNITY): Payer: BC Managed Care – PPO

## 2022-03-30 ENCOUNTER — Other Ambulatory Visit: Payer: Self-pay | Admitting: Orthopedic Surgery

## 2022-03-30 DIAGNOSIS — Z96651 Presence of right artificial knee joint: Secondary | ICD-10-CM

## 2022-04-13 ENCOUNTER — Encounter
Admission: RE | Admit: 2022-04-13 | Discharge: 2022-04-13 | Disposition: A | Discharge status: Home / Self Care | Payer: 59 | Source: Ambulatory Visit | Attending: Orthopedic Surgery | Admitting: Orthopedic Surgery

## 2022-04-13 DIAGNOSIS — Z96651 Presence of right artificial knee joint: Secondary | ICD-10-CM | POA: Diagnosis not present

## 2022-04-13 MED ORDER — TECHNETIUM TC 99M MEDRONATE IV KIT
20.0000 | PACK | Freq: Once | INTRAVENOUS | Status: AC | PRN
Start: 2022-04-13 — End: 2022-04-13
  Administered 2022-04-13: 20.09 via INTRAVENOUS

## 2022-08-20 ENCOUNTER — Encounter (HOSPITAL_COMMUNITY): Admission: RE | Admit: 2022-08-20 | Payer: 59 | Source: Ambulatory Visit

## 2022-09-02 ENCOUNTER — Inpatient Hospital Stay: Admit: 2022-09-02 | Payer: 59 | Admitting: Orthopedic Surgery

## 2022-09-02 SURGERY — TOTAL KNEE REVISION
Anesthesia: Choice | Site: Knee | Laterality: Right

## 2023-02-18 ENCOUNTER — Other Ambulatory Visit: Payer: Self-pay | Admitting: Gastroenterology

## 2023-02-18 ENCOUNTER — Encounter: Payer: Self-pay | Admitting: Gastroenterology

## 2023-02-18 DIAGNOSIS — Z8719 Personal history of other diseases of the digestive system: Secondary | ICD-10-CM

## 2023-03-09 ENCOUNTER — Ambulatory Visit
Admission: RE | Admit: 2023-03-09 | Discharge: 2023-03-09 | Disposition: A | Discharge status: Home / Self Care | Payer: 59 | Source: Ambulatory Visit | Attending: Gastroenterology | Admitting: Gastroenterology

## 2023-03-09 DIAGNOSIS — Z8719 Personal history of other diseases of the digestive system: Secondary | ICD-10-CM

## 2023-05-13 ENCOUNTER — Other Ambulatory Visit: Payer: Self-pay | Admitting: Student in an Organized Health Care Education/Training Program

## 2023-05-13 DIAGNOSIS — K1379 Other lesions of oral mucosa: Secondary | ICD-10-CM

## 2023-06-14 ENCOUNTER — Other Ambulatory Visit: Payer: Self-pay | Admitting: Student in an Organized Health Care Education/Training Program

## 2023-06-14 DIAGNOSIS — M7989 Other specified soft tissue disorders: Secondary | ICD-10-CM

## 2023-06-17 ENCOUNTER — Ambulatory Visit: Attending: Student in an Organized Health Care Education/Training Program

## 2023-07-05 ENCOUNTER — Inpatient Hospital Stay

## 2023-07-05 ENCOUNTER — Inpatient Hospital Stay: Admitting: Oncology

## 2023-07-09 ENCOUNTER — Inpatient Hospital Stay: Attending: Oncology | Admitting: Oncology

## 2023-07-09 ENCOUNTER — Inpatient Hospital Stay

## 2023-07-09 ENCOUNTER — Encounter: Payer: Self-pay | Admitting: Oncology

## 2023-07-09 VITALS — BP 136/86 | HR 78 | Temp 98.0°F | Resp 19 | Ht 66.0 in | Wt 158.6 lb

## 2023-07-09 DIAGNOSIS — Z853 Personal history of malignant neoplasm of breast: Secondary | ICD-10-CM | POA: Insufficient documentation

## 2023-07-09 DIAGNOSIS — Z1501 Genetic susceptibility to malignant neoplasm of breast: Secondary | ICD-10-CM | POA: Diagnosis not present

## 2023-07-09 DIAGNOSIS — R233 Spontaneous ecchymoses: Secondary | ICD-10-CM | POA: Insufficient documentation

## 2023-07-09 DIAGNOSIS — Z9013 Acquired absence of bilateral breasts and nipples: Secondary | ICD-10-CM | POA: Diagnosis not present

## 2023-07-09 LAB — CBC WITH DIFFERENTIAL/PLATELET
Abs Immature Granulocytes: 0.02 10*3/uL (ref 0.00–0.07)
Basophils Absolute: 0.1 10*3/uL (ref 0.0–0.1)
Basophils Relative: 1 %
Eosinophils Absolute: 0.1 10*3/uL (ref 0.0–0.5)
Eosinophils Relative: 2 %
HCT: 42.4 % (ref 36.0–46.0)
Hemoglobin: 14.7 g/dL (ref 12.0–15.0)
Immature Granulocytes: 0 %
Lymphocytes Relative: 34 %
Lymphs Abs: 1.9 10*3/uL (ref 0.7–4.0)
MCH: 32.7 pg (ref 26.0–34.0)
MCHC: 34.7 g/dL (ref 30.0–36.0)
MCV: 94.4 fL (ref 80.0–100.0)
Monocytes Absolute: 0.4 10*3/uL (ref 0.1–1.0)
Monocytes Relative: 7 %
Neutro Abs: 3.2 10*3/uL (ref 1.7–7.7)
Neutrophils Relative %: 56 %
Platelets: 217 10*3/uL (ref 150–400)
RBC: 4.49 MIL/uL (ref 3.87–5.11)
RDW: 11.8 % (ref 11.5–15.5)
WBC: 5.7 10*3/uL (ref 4.0–10.5)
nRBC: 0 % (ref 0.0–0.2)

## 2023-07-09 LAB — PROTIME-INR
INR: 1 (ref 0.8–1.2)
Prothrombin Time: 14 s (ref 11.4–15.2)

## 2023-07-09 LAB — APTT: aPTT: 29 s (ref 24–36)

## 2023-07-09 NOTE — Progress Notes (Unsigned)
 Hematology/Oncology Consult note [facility] Telephone:([phone] [phone]  Patient Care Team: Porter Devere LABOR, MD as PCP - General (Family Medicine)   Name of the patient: Belinda Norman  [id number]  1960/10/17    Reason for referral-concern for von Willebrand disease   Referring physician-Dr. Devere Porter  Date of visit: 07/09/23   History of presenting illness- Patient is a 63 year old female with a past medical history significant for anxiety and depression, history of hepatitis C, osteoporosis GERD among other medical problems. Hep C has been treated. Questionable history of cirrhosis although not seen on [facility] liver recently. Biopsies per patient have been negative for cirrhosis She has been referred for concerns of possible von Willebrand's disease.CBC from 05/12/2023 showed a normal white count of 7.2, H&H of 15.2/44.6 and a platelet count of 259.  Factor VIII level normal at 191.  Fibrinogen levels normal at 354 von Willebrand antigen 184 and ristocetin cofactor assay at 119.  Patient has not had any delayed postpartum bleeding with prior pregnancies.  She reports noticing superficial bruises after her right knee surgery.  These bruises come and go.  She has not had any gum bleeds or nosebleeds.  Patient has BRCA2 mutation and was diagnosed with stage I triple negative breast cancer back in August 2015.  She is s/p bilateral mastectomy with reconstruction as well as adjuvant chemotherapy ddAC, no taxol. reconstruction revision 10/23/2015 with Dr Roughton/Dr Damitz for Right axillary residual sx. B/L periprosthetic capsulectomies with fat transfer 04/2017 with Dr. Eudora (plastics) and has remained in remission since then.  She has also undergone hysterectomy and bilateral salpingo-oophorectomy.s/p Left skin-sparing mastectomy, right nipple-sparing mastectomy on 11/16/2013.  Seen at the breast survivorship clinic last in 2023 and was asked to follow-up  with plastic surgery  She continues to experience soreness at the site of right breast prosthesis.For consideration of MRIs every 2 years to assess for capsular rupture.  Her insurance did not approve her last MRI  ECOG PS- 1  Pain scale- 3   Review of systems- Review of Systems  Musculoskeletal:        Soreness at the site of right breast implant  Endo/Heme/Allergies:  Bruises/bleeds easily.    Allergies  Allergen Reactions   Percocet [Oxycodone -Acetaminophen ] Nausea And Vomiting    Severe n & v. 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/2021   Abdominal pain, epigastric    Heartburn    Screening for colon cancer    Polyp of colon    Total knee replacement status 06/22/2018   Stomach irritation 06/07/2017   Mastalgia 08/14/2016   History of mastectomy, right 08/10/2016   Primary osteoarthritis of right knee 03/24/2016   Muscle spasm 11/12/2015   Breast cancer (HCC) 09/27/2013   Osteoporosis 08/30/2013   Premature surgical menopause 08/30/2013   SI (sacroiliac) pain 12/21/2012   Lumbosacral facet joint syndrome 12/21/2012   Back pain 12/18/2012   Hemorrhoids 12/18/2012   Thrombocytopenia (HCC) 12/18/2012   GERD (  gastroesophageal reflux disease) 12/05/2011   Depression 12/05/2011   Hepatitis C 11/18/2011   Disorder of sacrum 09/15/2011     Past Medical History:  Diagnosis Date   Anxiety    Arthritis    Cancer (HCC)    breast cancer. had bilateral mastectomies   DCIS (ductal carcinoma in situ)     Depression    GERD (gastroesophageal reflux disease)    Hepatitis C    cured in 2014   HSV (herpes simplex virus) infection    IBS (irritable bowel syndrome)      Past Surgical History:  Procedure Laterality Date   ABDOMINAL HYSTERECTOMY     arthroscopic knee surgery     x2   BREAST SURGERY     mastectomies then reconstructive surgery times 2.   CESAREAN SECTION     CHOLECYSTECTOMY  10/1998   COLONOSCOPY WITH PROPOFOL  N/A 10/28/2018   Procedure: COLONOSCOPY WITH PROPOFOL ;  Surgeon: Janalyn Keene NOVAK, MD;  Location: [facility] ENDOSCOPY;  Service: Endoscopy;  Laterality: N/A;   ESOPHAGOGASTRODUODENOSCOPY (EGD) WITH PROPOFOL  N/A 10/28/2018   Procedure: ESOPHAGOGASTRODUODENOSCOPY (EGD) WITH PROPOFOL ;  Surgeon: Janalyn Keene NOVAK, MD;  Location: [facility] ENDOSCOPY;  Service: Endoscopy;  Laterality: N/A;   FOOT SURGERY     KNEE ARTHROPLASTY Right 06/22/2018   Procedure: COMPUTER ASSISTED TOTAL KNEE ARTHROPLASTY RIGHT - Will need an RNFA;  Surgeon: Mardee Lynwood SQUIBB, MD;  Location: [facility] ORS;  Service: Orthopedics;  Laterality: Right;   knee surgery with nerve repair     MASTECTOMY Bilateral    MASTECTOMY, MODIFIED RADICAL W/RECONSTRUCTION     TONSILLECTOMY     TOTAL ABDOMINAL HYSTERECTOMY W/ BILATERAL SALPINGOOPHORECTOMY  1999   TOTAL KNEE REVISION Right 06/11/2021   Procedure: Right knee polyethylene revision ;  Surgeon: Melodi Lerner, MD;  Location: [facility] ORS;  Service: Orthopedics;  Laterality: Right;    Social History   Socioeconomic History   Marital status: Divorced    Spouse name: has a significant other   Number of children: 3   Years of education: Not on file   Highest education level: Not on file  Occupational History   Not on file  Tobacco Use   Smoking status: Never   Smokeless tobacco: Never  Vaping Use   Vaping status: Never Used  Substance and Sexual Activity   Alcohol use: Yes    Comment: seldom   Drug use: Not Currently    Types: Marijuana   Sexual activity: Yes   Other Topics Concern   Not on file  Social History Narrative   Not on file   Social Drivers of Health   Financial Resource Strain: Low Risk  (02/11/2023)   Received from [facility]   Overall Financial Resource Strain (CARDIA)    Difficulty of Paying Living Expenses: Not hard at all  Food Insecurity: No Food Insecurity (02/11/2023)   Received from [facility]   Hunger Vital Sign    Within the past 12 months, you worried that your food would run out before you got the money to buy more.: Never true    Within the past 12 months, the food you bought just didn't last and you didn't have money to get more.: Never true  Transportation Needs: No Transportation Needs (02/11/2023)   Received from [facility] - Transportation    In the past 12 months, has lack of transportation kept you from medical appointments or from getting medications?: No    Lack of  Transportation (Non-Medical): No  Physical Activity: Not on file  Stress: Not on file  Social Connections: Not on file  Intimate Partner Violence: Not At Risk (01/30/2022)   Received from [facility]   Humiliation, Afraid, Rape, and Kick questionnaire    Within the last year, have you been afraid of your partner or ex-partner?: No    Within the last year, have you been humiliated or emotionally abused in other ways by your partner or ex-partner?: No    Within the last year, have you been kicked, hit, slapped, or otherwise physically hurt by your partner or ex-partner?: No    Within the last year, have you been raped or forced to have any kind of sexual activity by your partner or ex-partner?: No     Family History  Problem Relation Age of Onset   Hypertension Father    Liver cancer Mother        bile duct cancer   Alcohol abuse Mother    Diabetes Brother    Breast cancer Paternal Aunt    Throat cancer Paternal Grandmother    Prostate cancer Paternal Grandfather     Colon cancer Neg Hx      Current Outpatient Medications:    acetaminophen  (TYLENOL ) 500 MG tablet, Take 1,000 mg by mouth every 6 (six) hours as needed for moderate pain., Disp: , Rfl:    acyclovir  (ZOVIRAX ) 400 MG tablet, Take 400 mg by mouth 5 (five) times daily as needed (outbreaks)., Disp: , Rfl:    albuterol  (VENTOLIN  HFA) 108 (90 Base) MCG/ACT inhaler, Inhale 2 puffs into the lungs every 6 (six) hours as needed for wheezing., Disp: 18 g, Rfl: 0   Cholecalciferol  (VITAMIN D3) 1000 units CAPS, Take 2,000 Units by mouth daily. , Disp: , Rfl:    citalopram  (CELEXA ) 40 MG tablet, Take 40 mg by mouth daily., Disp: , Rfl:    clonazePAM  (KLONOPIN ) 1 MG tablet, Take 1 mg by mouth at bedtime as needed for anxiety., Disp: , Rfl:    desipramine (NORPRAMIN) 25 MG tablet, Take 25 mg by mouth at bedtime., Disp: , Rfl:    diazepam (VALIUM) 5 MG tablet, Take 5 mg by mouth as needed for anxiety., Disp: , Rfl:    fexofenadine (ALLEGRA) 180 MG tablet, Take 180 mg by mouth daily as needed for allergies., Disp: , Rfl:    HYDROmorphone  (DILAUDID ) 2 MG tablet, Take 1-2 tablets (2-4 mg total) by mouth every 6 (six) hours as needed for severe pain., Disp: 42 tablet, Rfl: 0   methocarbamol  (ROBAXIN ) 500 MG tablet, Take 1 tablet (500 mg total) by mouth every 6 (six) hours as needed for muscle spasms., Disp: 40 tablet, Rfl: 0   Spacer/Aero-Holding Chambers (AEROCHAMBER MV) inhaler, Use as instructed, Disp: 1 each, Rfl: 2   Physical exam: There were no vitals filed for this visit. Physical Exam  Cardiovascular:     Rate and Rhythm: Normal rate and regular rhythm.     Heart sounds: Normal heart sounds.  Pulmonary:     Effort: Pulmonary effort is normal.     Breath sounds: Normal breath sounds.  Abdominal:     General: Bowel sounds are normal.   Musculoskeletal:     Cervical back: Normal range of motion.   Skin:    General: Skin is warm and dry.   Neurological:     Mental Status: She is alert and  oriented to person, place, and time.   Chest wall exam: Patient is status  post bilateral mastectomy with reconstruction.  No palpable masses over chest wall.  No palpable bilateral axillary adenopathy.  There is more prominence of the right pectoralis muscle as compared to left.  There is also more fat bulge noted laterally along the right axillary line which is chronic       Latest Ref Rng & Units 06/12/2021    3:37 AM  CMP  Glucose 70 - 99 mg/dL 864   BUN 6 - 20 mg/dL 8   Creatinine 9.55 - 8.99 mg/dL 9.25   Sodium 864 - 854 mmol/L 142   Potassium 3.5 - 5.1 mmol/L 4.5   Chloride 98 - 111 mmol/L 110   CO2 22 - 32 mmol/L 24   Calcium 8.9 - 10.3 mg/dL 9.1       Latest Ref Rng & Units 06/12/2021    3:37 AM  CBC  WBC 4.0 - 10.5 K/uL 8.2   Hemoglobin 12.0 - 15.0 g/dL 87.5   Hematocrit 63.9 - 46.0 % 37.4   Platelets 150 - 400 K/uL 171       Assessment and plan- Patient is a 63 y.o. female followed for abnormal von Willebrand labs  Patient underwent a workup for concerns of possible bleeding disorder when she was noted to have increasing bruises over her right knee following right knee surgery.  On today's exam patient does not have any noticeable bruising.  I have reviewed labs done by her primary care provider which shows a normal CBC with a normal platelet count.  PT PTT and INR are normal.  Factor VIII level, von Willebrand antigen and activity are normal and the latter 2 were more than 50%.However the ratio between activity to antigen was less than 0.7 and therefore there was a concern for possible type II von Willebrand's or acquired von Willebrand.  Given that patient does not have any known history of bleeding diathesis all her life as well as the fact that her antigen and activity levels are more than 50% and it is unlikely that patient has inherited von Willebrand's disease.  Also typically with acquired von Willebrand's disease  would expect to see low factor VIII levels or prolonged  APTT which the patient does not have.  I will plan to check repeat von Willebrand panel as well as multimeric assay.  H/o breast cancer- she is now nearing 10 years from her diagnosis of breast cancer. She is s/p b/l mastectomy and TAH, BSO as well for BRCA2 gene mutation. She has chronic discomfort over the site of right breast reconstruction. Breast MRI previously was not approved by her insurance.   I will see her in 2 weeks to discuss her labs   Thank you for this kind referral and the opportunity to participate in the care of this patient   Visit Diagnosis 1. Easy bruising   2. History of breast cancer     Dr. Annah Skene, MD, MPH [facility] [phone] 07/09/2023

## 2023-07-11 LAB — VON WILLEBRAND PANEL
Coagulation Factor VIII: 131 % (ref 56–140)
Ristocetin Co-factor, Plasma: 91 % (ref 50–200)
Von Willebrand Antigen, Plasma: 139 % (ref 50–200)

## 2023-07-11 LAB — COAG STUDIES INTERP REPORT

## 2023-07-15 ENCOUNTER — Inpatient Hospital Stay: Admitting: Oncology

## 2023-07-15 NOTE — Progress Notes (Unsigned)
 Patient states she could not go through virtual rooming process due to having a procedure today and would just like to speak to Dr. Melanee at scheduled appointment time.

## 2023-07-21 LAB — VON WILLEBRAND FACTOR MULTIMER

## 2023-08-20 ENCOUNTER — Telehealth: Payer: Self-pay | Admitting: Oncology

## 2023-08-20 NOTE — Telephone Encounter (Signed)
 Video visit when she is at home sometime. We are really not offering routine telephone visits

## 2023-08-20 NOTE — Telephone Encounter (Signed)
 Pt calling and states she isn't able to do a MyChart video visit due to her being at work and her phone doesn't work at work. I offered the pt an appt to come in to the office and she states she doesn't wants to come into the office she wants Dr. Melanee to call her on the phone to give her the test results. The pt is requesting a callback.

## 2023-08-25 ENCOUNTER — Encounter: Payer: Self-pay | Admitting: Oncology

## 2023-08-26 ENCOUNTER — Inpatient Hospital Stay: Attending: Oncology | Admitting: Oncology

## 2023-10-01 ENCOUNTER — Encounter: Payer: Self-pay | Admitting: *Deleted

## 2023-10-04 ENCOUNTER — Encounter
Admission: RE | Disposition: A | Discharge status: Home / Self Care | Payer: Self-pay | Source: Home / Self Care | Attending: Gastroenterology

## 2023-10-04 ENCOUNTER — Ambulatory Visit
Admission: RE | Admit: 2023-10-04 | Discharge: 2023-10-04 | Disposition: A | Discharge status: Home / Self Care | Attending: Gastroenterology | Admitting: Gastroenterology

## 2023-10-04 ENCOUNTER — Ambulatory Visit: Admitting: Certified Registered"

## 2023-10-04 DIAGNOSIS — F32A Depression, unspecified: Secondary | ICD-10-CM | POA: Diagnosis not present

## 2023-10-04 DIAGNOSIS — K589 Irritable bowel syndrome without diarrhea: Secondary | ICD-10-CM | POA: Diagnosis not present

## 2023-10-04 DIAGNOSIS — Z9049 Acquired absence of other specified parts of digestive tract: Secondary | ICD-10-CM | POA: Diagnosis not present

## 2023-10-04 DIAGNOSIS — K2970 Gastritis, unspecified, without bleeding: Secondary | ICD-10-CM | POA: Insufficient documentation

## 2023-10-04 DIAGNOSIS — Z1211 Encounter for screening for malignant neoplasm of colon: Secondary | ICD-10-CM | POA: Diagnosis present

## 2023-10-04 DIAGNOSIS — Z90710 Acquired absence of both cervix and uterus: Secondary | ICD-10-CM | POA: Diagnosis not present

## 2023-10-04 DIAGNOSIS — K648 Other hemorrhoids: Secondary | ICD-10-CM | POA: Diagnosis not present

## 2023-10-04 DIAGNOSIS — K7460 Unspecified cirrhosis of liver: Secondary | ICD-10-CM | POA: Diagnosis not present

## 2023-10-04 DIAGNOSIS — K641 Second degree hemorrhoids: Secondary | ICD-10-CM | POA: Insufficient documentation

## 2023-10-04 DIAGNOSIS — F419 Anxiety disorder, unspecified: Secondary | ICD-10-CM | POA: Insufficient documentation

## 2023-10-04 HISTORY — PX: COLONOSCOPY: SHX5424

## 2023-10-04 HISTORY — DX: Age-related osteoporosis without current pathological fracture: M81.0

## 2023-10-04 HISTORY — DX: Dorsalgia, unspecified: M54.9

## 2023-10-04 HISTORY — DX: Personal history of antineoplastic chemotherapy: Z92.21

## 2023-10-04 HISTORY — DX: Unspecified malignant neoplasm of skin, unspecified: C44.90

## 2023-10-04 HISTORY — PX: ESOPHAGOGASTRODUODENOSCOPY: SHX5428

## 2023-10-04 SURGERY — COLONOSCOPY
Anesthesia: General

## 2023-10-04 MED ORDER — GLYCOPYRROLATE 0.2 MG/ML IJ SOLN
INTRAMUSCULAR | Status: DC | PRN
  Administered 2023-10-04: .2 mg via INTRAVENOUS

## 2023-10-04 MED ORDER — PROPOFOL 10 MG/ML IV BOLUS
INTRAVENOUS | Status: DC | PRN
  Administered 2023-10-04: 70 mg via INTRAVENOUS

## 2023-10-04 MED ORDER — PROPOFOL 500 MG/50ML IV EMUL
INTRAVENOUS | Status: DC | PRN
  Administered 2023-10-04: 120 ug/kg/min via INTRAVENOUS

## 2023-10-04 MED ORDER — LIDOCAINE 2% (20 MG/ML) 5 ML SYRINGE
INTRAMUSCULAR | Status: DC | PRN
  Administered 2023-10-04: 20 mg via INTRAVENOUS

## 2023-10-04 MED ORDER — MIDAZOLAM HCL 2 MG/2ML IJ SOLN
INTRAMUSCULAR | Status: AC
  Filled 2023-10-04: qty 2

## 2023-10-04 MED ORDER — GLYCOPYRROLATE 0.2 MG/ML IJ SOLN
INTRAMUSCULAR | Status: AC
  Filled 2023-10-04: qty 1

## 2023-10-04 MED ORDER — SODIUM CHLORIDE 0.9 % IV SOLN: INTRAVENOUS | Status: DC

## 2023-10-04 MED ORDER — MIDAZOLAM HCL 5 MG/5ML IJ SOLN
INTRAMUSCULAR | Status: DC | PRN
  Administered 2023-10-04: 2 mg via INTRAVENOUS

## 2023-10-04 NOTE — Interval H&P Note (Signed)
 History and Physical Interval Note:  10/04/2023 10:16 AM  Belinda Norman  has presented today for surgery, with the diagnosis of h/o Cirrhosis PH TA polyps.  The various methods of treatment have been discussed with the patient and family. After consideration of risks, benefits and other options for treatment, the patient has consented to  Procedure(s): COLONOSCOPY (N/A) EGD (ESOPHAGOGASTRODUODENOSCOPY) (N/A) as a surgical intervention.  The patient's history has been reviewed, patient examined, no change in status, stable for surgery.  I have reviewed the patient's chart and labs.  Questions were answered to the patient's satisfaction.     Ole ONEIDA Schick  Ok to proceed with EGD/Colonoscopy

## 2023-10-04 NOTE — Op Note (Signed)
 [facility] Gastroenterology Patient Name: Belinda Norman Procedure Date: 10/04/2023 10:11 AM MRN: [id number] Account #: [id number] Date of Birth: [birth year] Admit Type: Outpatient Age: 63 Room: [facility] ENDO ROOM 3 Gender: Female Note Status: Finalized Instrument Name: Barnie GI Scope [id number] Procedure:             Upper GI endoscopy Indications:           Cirrhosis rule out esophageal varices Providers:             Ole Schick MD, MD Complications:         No immediate complications. Estimated blood loss:                         Minimal. Procedure:             Pre-Anesthesia Assessment:                        - Prior to the procedure, a History and Physical was                         performed, and patient medications and allergies were                         reviewed. The patient is competent. The risks and                         benefits of the procedure and the sedation options and                         risks were discussed with the patient. All questions                         were answered and informed consent was obtained.                         Patient identification and proposed procedure were                         verified by the physician, the nurse, the                         anesthesiologist, the anesthetist and the technician                         in the endoscopy suite. Mental Status Examination:                         alert and oriented. Airway Examination: normal                         oropharyngeal airway and neck mobility. Respiratory                         Examination: clear to auscultation. CV Examination:                         normal. Prophylactic Antibiotics: The patient does not  require prophylactic antibiotics. Prior                         Anticoagulants: The patient has taken no anticoagulant                         or antiplatelet agents. ASA Grade Assessment: II - A                          patient with mild systemic disease. After reviewing                         the risks and benefits, the patient was deemed in                         satisfactory condition to undergo the procedure. The                         anesthesia plan was to use monitored anesthesia care                         (MAC). Immediately prior to administration of                         medications, the patient was re-assessed for adequacy                         to receive sedatives. The heart rate, respiratory                         rate, oxygen saturations, blood pressure, adequacy of                         pulmonary ventilation, and response to care were                         monitored throughout the procedure. The physical                         status of the patient was re-assessed after the                         procedure.                        After obtaining informed consent, the endoscope was                         passed under direct vision. Throughout the procedure,                         the patient's blood pressure, pulse, and oxygen                         saturations were monitored continuously. The Endoscope                         was introduced through the mouth, and advanced to the  second part of duodenum. The upper GI endoscopy was                         accomplished without difficulty. The patient tolerated                         the procedure well. Findings:      The examined esophagus was normal.      Patchy mild inflammation characterized by erythema was found in the       gastric antrum. Biopsies were taken with a cold forceps for Helicobacter       pylori testing. Estimated blood loss was minimal.      The examined duodenum was normal. Impression:            - Normal esophagus.                        - Gastritis. Biopsied.                        - Normal examined duodenum. Recommendation:        - Discharge patient to home.                         - Resume previous diet.                        - Continue present medications.                        - Await pathology results.                        - Return to referring physician as previously                         scheduled. Procedure Code(s):     --- Professional ---                        [phone], Esophagogastroduodenoscopy, flexible,                         transoral; with biopsy, single or multiple Diagnosis Code(s):     --- Professional ---                        K29.70, Gastritis, unspecified, without bleeding                        K74.60, Unspecified cirrhosis of liver CPT copyright 2022 American Medical Association. All rights reserved. The codes documented in this report are preliminary and upon coder review may  be revised to meet current compliance requirements. Ole Schick MD, MD 10/04/2023 10:43:11 AM Number of Addenda: 0 Note Initiated On: 10/04/2023 10:11 AM Estimated Blood Loss:  Estimated blood loss was minimal.      [facility]

## 2023-10-04 NOTE — Anesthesia Preprocedure Evaluation (Signed)
 Anesthesia Evaluation  Patient identified by MRN, date of birth, ID band Patient awake    Reviewed: Allergy & Precautions, H&P , NPO status , Patient's Chart, lab work & pertinent test results, reviewed documented beta blocker date and time   Airway Mallampati: II   Neck ROM: full    Dental  (+) Poor Dentition   Pulmonary neg pulmonary ROS   Pulmonary exam normal        Cardiovascular negative cardio ROS Normal cardiovascular exam Rhythm:regular Rate:Normal     Neuro/Psych   Anxiety Depression    negative neurological ROS  negative psych ROS   GI/Hepatic ,GERD  Medicated,,(+) Hepatitis -  Endo/Other  negative endocrine ROS    Renal/GU negative Renal ROS  negative genitourinary   Musculoskeletal   Abdominal   Peds  Hematology negative hematology ROS (+)   Anesthesia Other Findings Past Medical History: No date: Anxiety No date: Arthritis No date: Back pain No date: Breast cancer (HCC) No date: DCIS (ductal carcinoma in situ) No date: Depression No date: GERD (gastroesophageal reflux disease) No date: Hepatitis C     Comment:  cured in 2014 No date: History of antineoplastic chemotherapy No date: HSV (herpes simplex virus) infection No date: IBS (irritable bowel syndrome) No date: Osteoporosis No date: Skin cancer Past Surgical History: No date: ABDOMINAL HYSTERECTOMY No date: arthroscopic knee surgery     Comment:  x2 No date: BREAST SURGERY     Comment:  mastectomies then reconstructive surgery times 2. No date: CESAREAN SECTION 10/1998: CHOLECYSTECTOMY 10/28/2018: COLONOSCOPY WITH PROPOFOL ; N/A     Comment:  Procedure: COLONOSCOPY WITH PROPOFOL ;  Surgeon:               Janalyn Keene NOVAK, MD;  Location: [facility] ENDOSCOPY;                Service: Endoscopy;  Laterality: N/A; 10/28/2018: ESOPHAGOGASTRODUODENOSCOPY (EGD) WITH PROPOFOL ; N/A     Comment:  Procedure: ESOPHAGOGASTRODUODENOSCOPY (EGD) WITH                PROPOFOL ;  Surgeon: Janalyn Keene NOVAK, MD;  Location:               [facility] ENDOSCOPY;  Service: Endoscopy;  Laterality: N/A; No date: FOOT SURGERY 06/22/2018: KNEE ARTHROPLASTY; Right     Comment:  Procedure: COMPUTER ASSISTED TOTAL KNEE ARTHROPLASTY               RIGHT - Will need an RNFA;  Surgeon: Mardee Lynwood SQUIBB, MD;              Location: [facility] ORS;  Service: Orthopedics;  Laterality:               Right; No date: knee surgery with nerve repair No date: MASTECTOMY; Bilateral No date: MASTECTOMY, MODIFIED RADICAL W/RECONSTRUCTION No date: TONSILLECTOMY 1999: TOTAL ABDOMINAL HYSTERECTOMY W/ BILATERAL SALPINGOOPHORECTOMY 06/11/2021: TOTAL KNEE REVISION; Right     Comment:  Procedure: Right knee polyethylene revision ;  Surgeon:               Melodi Lerner, MD;  Location: [facility] ORS;  Service:               Orthopedics;  Laterality: Right;   Reproductive/Obstetrics negative OB ROS                              Anesthesia Physical Anesthesia Plan  ASA: 2  Anesthesia Plan: General  Post-op Pain Management:    Induction:   PONV Risk Score and Plan:   Airway Management Planned:   Additional Equipment:   Intra-op Plan:   Post-operative Plan:   Informed Consent: I have reviewed the patients History and Physical, chart, labs and discussed the procedure including the risks, benefits and alternatives for the proposed anesthesia with the patient or authorized representative who has indicated his/her understanding and acceptance.     Dental Advisory Given  Plan Discussed with: CRNA  Anesthesia Plan Comments:         Anesthesia Quick Evaluation

## 2023-10-04 NOTE — Transfer of Care (Signed)
 Immediate Anesthesia Transfer of Care Note  Patient: Belinda Norman  Procedure(s) Performed: COLONOSCOPY EGD (ESOPHAGOGASTRODUODENOSCOPY)  Patient Location: Endoscopy Unit  Anesthesia Type:General  Level of Consciousness: awake and drowsy  Airway & Oxygen Therapy: Patient Spontanous Breathing  Post-op Assessment: Report given to RN and Post -op Vital signs reviewed and stable  Post vital signs: Reviewed  Last Vitals:  Vitals Value Taken Time  BP 96/73   Temp    Pulse 78   Resp 16   SpO2 100     Last Pain:  Vitals:   10/04/23 0939  TempSrc: Temporal  PainSc: 0-No pain         Complications: No notable events documented.

## 2023-10-04 NOTE — Anesthesia Postprocedure Evaluation (Signed)
 Anesthesia Post Note  Patient: Belinda Norman  Procedure(s) Performed: COLONOSCOPY EGD (ESOPHAGOGASTRODUODENOSCOPY)  Patient location during evaluation: PACU Anesthesia Type: General Level of consciousness: awake and alert Pain management: pain level controlled Vital Signs Assessment: post-procedure vital signs reviewed and stable Respiratory status: spontaneous breathing, nonlabored ventilation, respiratory function stable and patient connected to nasal cannula oxygen Cardiovascular status: blood pressure returned to baseline and stable Postop Assessment: no apparent nausea or vomiting Anesthetic complications: no   No notable events documented.   Last Vitals:  Vitals:   10/04/23 1051 10/04/23 1104  BP: 105/77 118/74  Pulse: 87 73  Resp: 18 17  Temp:    SpO2: 100% 100%    Last Pain:  Vitals:   10/04/23 1104  TempSrc:   PainSc: 0-No pain                 Lynwood KANDICE Clause

## 2023-10-04 NOTE — H&P (Signed)
 Outpatient short stay form Pre-procedure 10/04/2023  Belinda ONEIDA Schick, MD  Primary Physician: Porter Devere LABOR, MD  Reason for visit:  Cirrhosis/Personal history of polyps  History of present illness:    63 y/o lady with history of hep c cirrhosis, IBS C, and depression here for EGD/Colonoscopy for history of cirrhosis and history of adenomatous polyps. Last colonoscopy in 2020 was unremarkable. No blood thinners. No family history of GI malignancies. History of cholecystectomy and total hysterectomy.    Current Facility-Administered Medications:    0.9 %  sodium chloride  infusion, , Intravenous, Continuous, Trenyce Loera, Belinda ONEIDA, MD, Last Rate: 20 mL/hr at 10/04/23 0946, New Bag at 10/04/23 0946  Medications Prior to Admission  Medication Sig Dispense Refill Last Dose/Taking   gabapentin  (NEURONTIN ) 300 MG capsule Take 300 mg by mouth 3 (three) times daily.   Taking   acetaminophen  (TYLENOL ) 500 MG tablet Take 1,000 mg by mouth every 6 (six) hours as needed for moderate pain.      acyclovir  (ZOVIRAX ) 400 MG tablet Take 400 mg by mouth 5 (five) times daily as needed (outbreaks).      albuterol  (VENTOLIN  HFA) 108 (90 Base) MCG/ACT inhaler Inhale 2 puffs into the lungs every 6 (six) hours as needed for wheezing. 18 g 0    Cholecalciferol  (VITAMIN D3) 1000 units CAPS Take 2,000 Units by mouth daily.       citalopram  (CELEXA ) 40 MG tablet Take 40 mg by mouth daily.      clonazePAM  (KLONOPIN ) 1 MG tablet Take 1 mg by mouth at bedtime as needed for anxiety.      desipramine (NORPRAMIN) 25 MG tablet Take 25 mg by mouth at bedtime.      diazepam (VALIUM) 5 MG tablet Take 5 mg by mouth as needed for anxiety. (Patient not taking: Reported on 07/09/2023)      famotidine  (PEPCID ) 40 MG tablet Take 1 tablet by mouth at bedtime.      fexofenadine (ALLEGRA) 180 MG tablet Take 180 mg by mouth daily as needed for allergies.      fluticasone  (FLONASE ) 50 MCG/ACT nasal spray Place 1 spray into the nose.       HYDROmorphone  (DILAUDID ) 2 MG tablet Take 1-2 tablets (2-4 mg total) by mouth every 6 (six) hours as needed for severe pain. 42 tablet 0    lansoprazole (PREVACID) 30 MG capsule Take 30 mg by mouth.      methocarbamol  (ROBAXIN ) 500 MG tablet Take 1 tablet (500 mg total) by mouth every 6 (six) hours as needed for muscle spasms. (Patient not taking: Reported on 07/09/2023) 40 tablet 0    Spacer/Aero-Holding Chambers (AEROCHAMBER MV) inhaler Use as instructed 1 each 2      Allergies  Allergen Reactions   Percocet [Oxycodone -Acetaminophen ] Nausea And Vomiting    Severe n & v. Could only take Dilaudid    Zofran  [Ondansetron  Hcl] Other (See Comments)    headaches   Pantoprazole  Nausea Only   Codeine Nausea And Vomiting and Nausea Only    headache   Epinephrine  Other (See Comments)    Causes craziness with dental cases only   Morphine  And Codeine Nausea Only and Other (See Comments)    Severe headaches During breast surgeries, did not work for pain     Past Medical History:  Diagnosis Date   Anxiety    Arthritis    Back pain    Breast cancer (HCC)    DCIS (ductal carcinoma in situ)    Depression  GERD (gastroesophageal reflux disease)    Hepatitis C    cured in 2014   History of antineoplastic chemotherapy    HSV (herpes simplex virus) infection    IBS (irritable bowel syndrome)    Osteoporosis    Skin cancer     Review of systems:  Otherwise negative.    Physical Exam  Gen: Alert, oriented. Appears stated age.  HEENT: PERRLA. Lungs: No respiratory distress CV: RRR Abd: soft, benign, no masses Ext: No edema   Planned procedures: Proceed with EGD/colonoscopy. The patient understands the nature of the planned procedure, indications, risks, alternatives and potential complications including but not limited to bleeding, infection, perforation, damage to internal organs and possible oversedation/side effects from anesthesia. The patient agrees and gives consent to  proceed.  Please refer to procedure notes for findings, recommendations and patient disposition/instructions.     Belinda ONEIDA Schick, MD [facility]

## 2023-10-04 NOTE — Op Note (Signed)
 [facility] Patient Name: Belinda Norman Procedure Date: 10/04/2023 10:04 AM MRN: [id number] Account #: [id number] Date of Birth: [birth year] Admit Type: Outpatient Age: 63 Room: [facility] ENDO ROOM 3 Gender: Female Note Status: Finalized Instrument Name: Colon Scope [id number] Procedure:             Colonoscopy Indications:           Surveillance: Personal history of colonic polyps                         (unknown histology) on last colonoscopy more than 5                         years ago Providers:             Ole Schick MD, MD Medicines:             Monitored Anesthesia Care Complications:         No immediate complications. Procedure:             Pre-Anesthesia Assessment:                        - Prior to the procedure, a History and Physical was                         performed, and patient medications and allergies were                         reviewed. The patient is competent. The risks and                         benefits of the procedure and the sedation options and                         risks were discussed with the patient. All questions                         were answered and informed consent was obtained.                         Patient identification and proposed procedure were                         verified by the physician, the nurse, the                         anesthesiologist, the anesthetist and the technician                         in the endoscopy suite. Mental Status Examination:                         alert and oriented. Airway Examination: normal                         oropharyngeal airway and neck mobility. Respiratory                         Examination: clear to auscultation. CV Examination:  normal. Prophylactic Antibiotics: The patient does not                         require prophylactic antibiotics. Prior                         Anticoagulants: The patient has taken no anticoagulant                          or antiplatelet agents. ASA Grade Assessment: II - A                         patient with mild systemic disease. After reviewing                         the risks and benefits, the patient was deemed in                         satisfactory condition to undergo the procedure. The                         anesthesia plan was to use monitored anesthesia care                         (MAC). Immediately prior to administration of                         medications, the patient was re-assessed for adequacy                         to receive sedatives. The heart rate, respiratory                         rate, oxygen saturations, blood pressure, adequacy of                         pulmonary ventilation, and response to care were                         monitored throughout the procedure. The physical                         status of the patient was re-assessed after the                         procedure.                        After obtaining informed consent, the colonoscope was                         passed under direct vision. Throughout the procedure,                         the patient's blood pressure, pulse, and oxygen                         saturations were monitored continuously. The  Colonoscope was introduced through the anus and                         advanced to the the terminal ileum, with                         identification of the appendiceal orifice and IC                         valve. The colonoscopy was performed without                         difficulty. The patient tolerated the procedure well.                         The quality of the bowel preparation was good. The                         terminal ileum, ileocecal valve, appendiceal orifice,                         and rectum were photographed. Findings:      The perianal and digital rectal examinations were normal.      The terminal ileum appeared normal.       Internal hemorrhoids were found during retroflexion. The hemorrhoids       were Grade II (internal hemorrhoids that prolapse but reduce       spontaneously).      The exam was otherwise without abnormality on direct and retroflexion       views. Impression:            - The examined portion of the ileum was normal.                        - Internal hemorrhoids.                        - The examination was otherwise normal on direct and                         retroflexion views.                        - No specimens collected. Recommendation:        - Discharge patient to home.                        - Resume previous diet.                        - Continue present medications.                        - Repeat colonoscopy in 5 years for surveillance.                        - Return to referring physician as previously                         scheduled. Procedure Code(s):     --- Professional ---  G0105, Colorectal cancer screening; colonoscopy on                         individual at high risk Diagnosis Code(s):     --- Professional ---                        Z86.010, Personal history of colonic polyps                        K64.1, Second degree hemorrhoids CPT copyright 2022 [employer]. All rights reserved. The codes documented in this report are preliminary and upon coder review may  be revised to meet current compliance requirements. Ole Schick MD, MD 10/04/2023 10:46:01 AM Number of Addenda: 0 Note Initiated On: 10/04/2023 10:04 AM Scope Withdrawal Time: 0 hours 6 minutes 28 seconds  Total Procedure Duration: 0 hours 9 minutes 15 seconds  Estimated Blood Loss:  Estimated blood loss: none.      [facility]

## 2023-10-05 LAB — SURGICAL PATHOLOGY

## 2023-11-01 ENCOUNTER — Other Ambulatory Visit: Payer: Self-pay | Admitting: Surgery

## 2023-11-01 DIAGNOSIS — M4802 Spinal stenosis, cervical region: Secondary | ICD-10-CM

## 2023-11-04 ENCOUNTER — Ambulatory Visit: Admission: RE | Admit: 2023-11-04

## 2024-03-20 ENCOUNTER — Ambulatory Visit
